# Patient Record
Sex: Female | Born: 1980 | Race: Black or African American | Hispanic: No | Marital: Married | Smoking: Former smoker
Health system: Southern US, Community
[De-identification: ages and names within clinical notes are randomized; demographics above are authoritative.]

## PROBLEM LIST (undated history)

## (undated) DIAGNOSIS — R350 Frequency of micturition: Secondary | ICD-10-CM

## (undated) DIAGNOSIS — Z8669 Personal history of other diseases of the nervous system and sense organs: Secondary | ICD-10-CM

## (undated) DIAGNOSIS — R3915 Urgency of urination: Secondary | ICD-10-CM

## (undated) DIAGNOSIS — J45909 Unspecified asthma, uncomplicated: Secondary | ICD-10-CM

## (undated) DIAGNOSIS — R319 Hematuria, unspecified: Secondary | ICD-10-CM

## (undated) DIAGNOSIS — N201 Calculus of ureter: Secondary | ICD-10-CM

## (undated) DIAGNOSIS — N200 Calculus of kidney: Secondary | ICD-10-CM

## (undated) DIAGNOSIS — Z87442 Personal history of urinary calculi: Secondary | ICD-10-CM

## (undated) HISTORY — PX: TUBAL LIGATION: SHX77

## (undated) HISTORY — PX: TONSILLECTOMY: SUR1361

---
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
## 1999-05-22 ENCOUNTER — Emergency Department (HOSPITAL_COMMUNITY): Admission: EM | Admit: 1999-05-22 | Discharge: 1999-05-22 | Payer: Self-pay | Admitting: Emergency Medicine

## 1999-10-26 ENCOUNTER — Inpatient Hospital Stay (HOSPITAL_COMMUNITY): Admission: AD | Admit: 1999-10-26 | Discharge: 1999-10-26 | Payer: Self-pay | Admitting: *Deleted

## 1999-10-29 ENCOUNTER — Inpatient Hospital Stay (HOSPITAL_COMMUNITY): Admission: AD | Admit: 1999-10-29 | Discharge: 1999-10-29 | Payer: Self-pay | Admitting: Obstetrics & Gynecology

## 1999-11-02 ENCOUNTER — Inpatient Hospital Stay (HOSPITAL_COMMUNITY): Admission: AD | Admit: 1999-11-02 | Discharge: 1999-11-02 | Payer: Self-pay | Admitting: *Deleted

## 1999-11-06 ENCOUNTER — Inpatient Hospital Stay (HOSPITAL_COMMUNITY): Admission: AD | Admit: 1999-11-06 | Discharge: 1999-11-06 | Payer: Self-pay | Admitting: *Deleted

## 1999-11-12 ENCOUNTER — Other Ambulatory Visit: Admission: RE | Admit: 1999-11-12 | Discharge: 1999-11-12 | Payer: Self-pay | Admitting: Obstetrics and Gynecology

## 1999-12-11 ENCOUNTER — Observation Stay (HOSPITAL_COMMUNITY): Admission: AD | Admit: 1999-12-11 | Discharge: 1999-12-11 | Payer: Self-pay | Admitting: Obstetrics & Gynecology

## 1999-12-11 ENCOUNTER — Encounter (INDEPENDENT_AMBULATORY_CARE_PROVIDER_SITE_OTHER): Payer: Self-pay

## 2000-12-22 ENCOUNTER — Emergency Department (HOSPITAL_COMMUNITY): Admission: EM | Admit: 2000-12-22 | Discharge: 2000-12-22 | Payer: Self-pay | Admitting: Emergency Medicine

## 2001-06-11 ENCOUNTER — Emergency Department (HOSPITAL_COMMUNITY): Admission: EM | Admit: 2001-06-11 | Discharge: 2001-06-11 | Payer: Self-pay | Admitting: Emergency Medicine

## 2001-06-11 ENCOUNTER — Encounter: Payer: Self-pay | Admitting: Emergency Medicine

## 2001-06-15 ENCOUNTER — Emergency Department (HOSPITAL_COMMUNITY): Admission: EM | Admit: 2001-06-15 | Discharge: 2001-06-15 | Payer: Self-pay | Admitting: Emergency Medicine

## 2001-06-15 ENCOUNTER — Encounter: Payer: Self-pay | Admitting: Emergency Medicine

## 2002-04-19 ENCOUNTER — Encounter: Payer: Self-pay | Admitting: Emergency Medicine

## 2002-04-19 ENCOUNTER — Emergency Department (HOSPITAL_COMMUNITY): Admission: EM | Admit: 2002-04-19 | Discharge: 2002-04-19 | Payer: Self-pay | Admitting: Emergency Medicine

## 2002-05-07 ENCOUNTER — Encounter: Admission: RE | Admit: 2002-05-07 | Discharge: 2002-05-07 | Payer: Self-pay | Admitting: *Deleted

## 2003-10-19 ENCOUNTER — Inpatient Hospital Stay (HOSPITAL_COMMUNITY): Admission: AD | Admit: 2003-10-19 | Discharge: 2003-10-20 | Payer: Self-pay | Admitting: Obstetrics and Gynecology

## 2004-04-21 ENCOUNTER — Inpatient Hospital Stay (HOSPITAL_COMMUNITY): Admission: AD | Admit: 2004-04-21 | Discharge: 2004-04-22 | Payer: Self-pay | Admitting: Obstetrics and Gynecology

## 2004-05-26 ENCOUNTER — Inpatient Hospital Stay (HOSPITAL_COMMUNITY): Admission: AD | Admit: 2004-05-26 | Discharge: 2004-05-29 | Payer: Self-pay | Admitting: Obstetrics

## 2004-06-05 ENCOUNTER — Inpatient Hospital Stay (HOSPITAL_COMMUNITY): Admission: AD | Admit: 2004-06-05 | Discharge: 2004-06-06 | Payer: Self-pay | Admitting: Obstetrics & Gynecology

## 2004-06-11 ENCOUNTER — Inpatient Hospital Stay (HOSPITAL_COMMUNITY): Admission: AD | Admit: 2004-06-11 | Discharge: 2004-06-14 | Payer: Self-pay | Admitting: Obstetrics & Gynecology

## 2004-06-18 ENCOUNTER — Encounter: Admission: RE | Admit: 2004-06-18 | Discharge: 2004-07-18 | Payer: Self-pay | Admitting: Obstetrics

## 2004-06-27 ENCOUNTER — Inpatient Hospital Stay (HOSPITAL_COMMUNITY): Admission: RE | Admit: 2004-06-27 | Discharge: 2004-06-29 | Payer: Self-pay | Admitting: Obstetrics & Gynecology

## 2004-07-19 ENCOUNTER — Encounter: Admission: RE | Admit: 2004-07-19 | Discharge: 2004-08-18 | Payer: Self-pay | Admitting: Obstetrics & Gynecology

## 2007-01-21 ENCOUNTER — Emergency Department (HOSPITAL_COMMUNITY): Admission: EM | Admit: 2007-01-21 | Discharge: 2007-01-21 | Payer: Self-pay | Admitting: Family Medicine

## 2009-07-29 ENCOUNTER — Inpatient Hospital Stay (HOSPITAL_COMMUNITY): Admission: AD | Admit: 2009-07-29 | Discharge: 2009-07-29 | Payer: Self-pay | Admitting: Obstetrics

## 2009-08-27 ENCOUNTER — Ambulatory Visit (HOSPITAL_COMMUNITY): Admission: RE | Admit: 2009-08-27 | Discharge: 2009-08-27 | Payer: Self-pay | Admitting: Obstetrics & Gynecology

## 2009-08-27 IMAGING — US US OB COMP LESS 14 WK
1 series · 14 of 16 positions shown · non-contrast
Comparison: none

OBSTETRICAL ULTRASOUND:
 This ultrasound was performed in The [HOSPITAL], and the [facility] report will be stored to [REDACTED] PACS.  This report is also available in [HOSPITAL]?s accessANYware.

[Series 1: us ob comp less 14 wk · 14 of 16 slices shown]
[im 1/16]
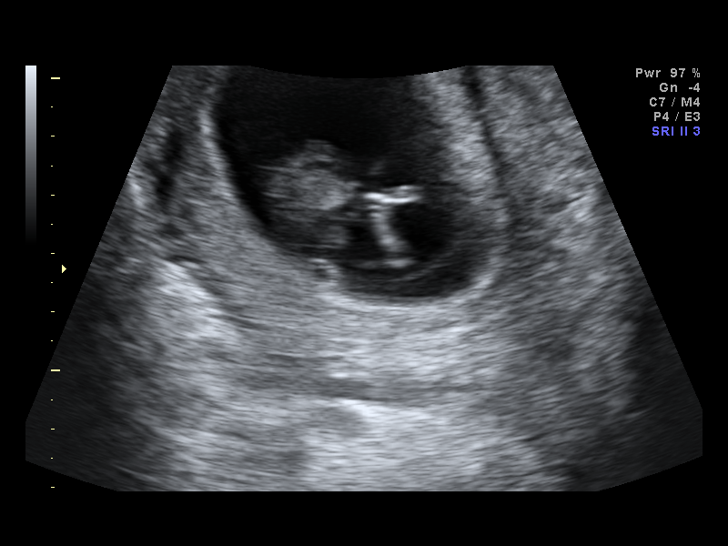
[im 2/16]
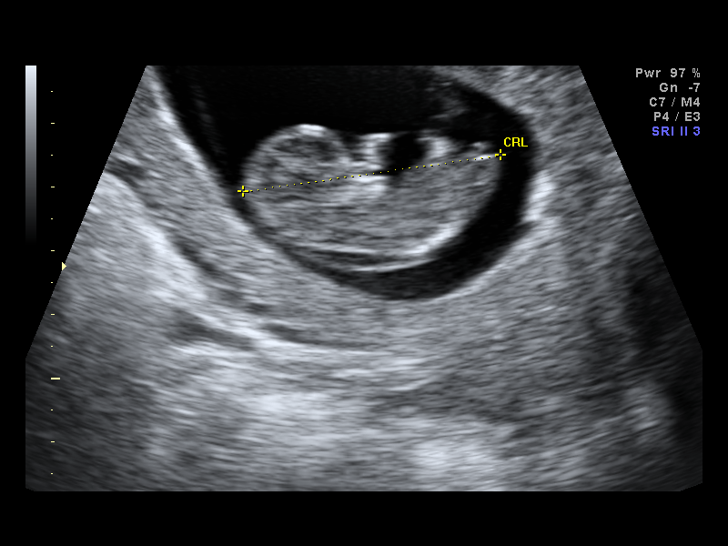
[im 3/16]
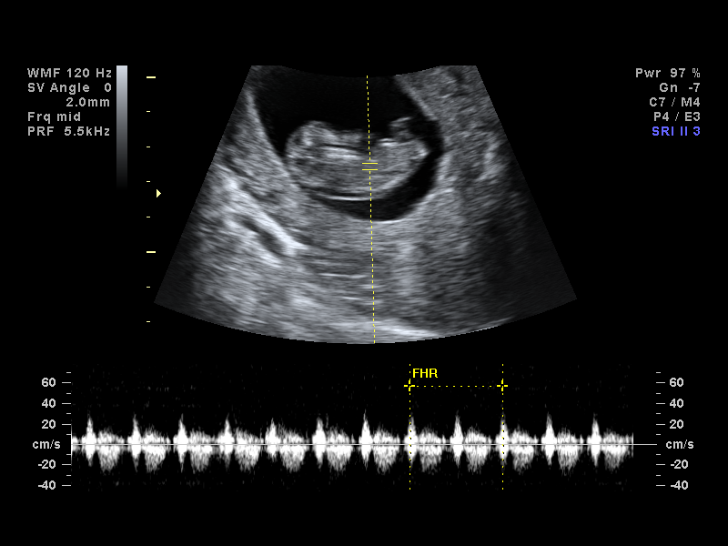
[im 5/16]
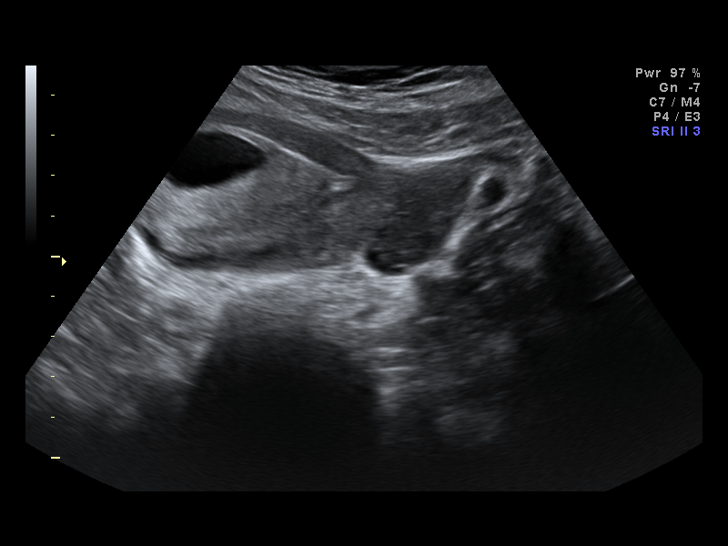
[im 6/16]
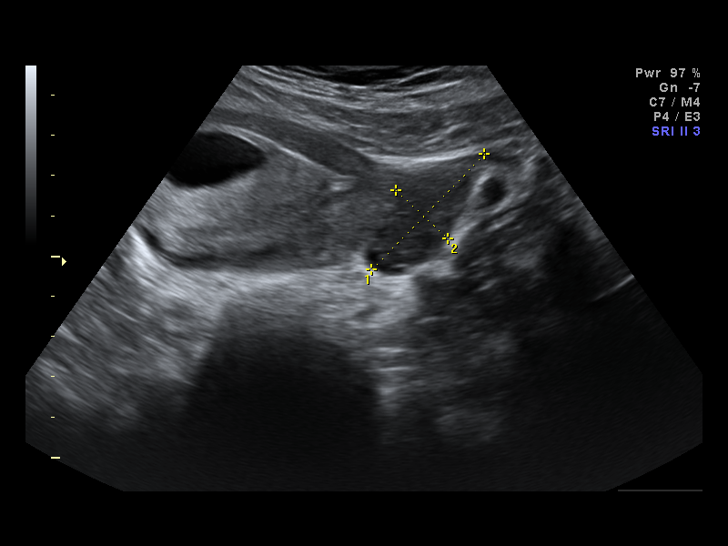
[im 7/16]
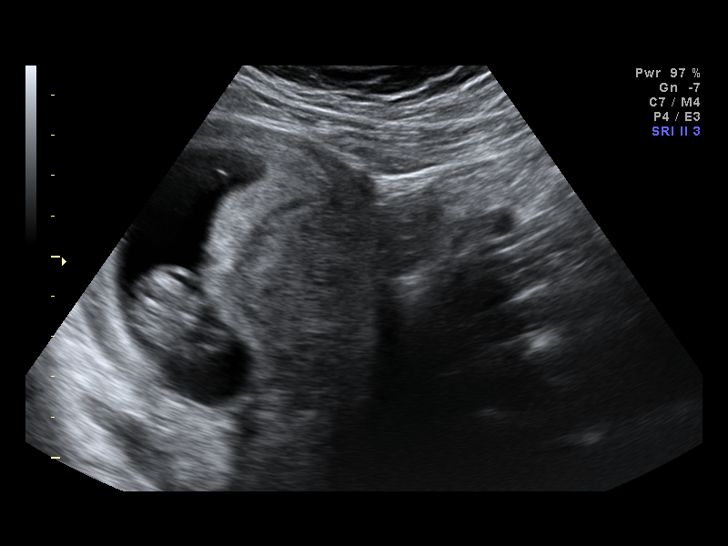
[im 8/16]
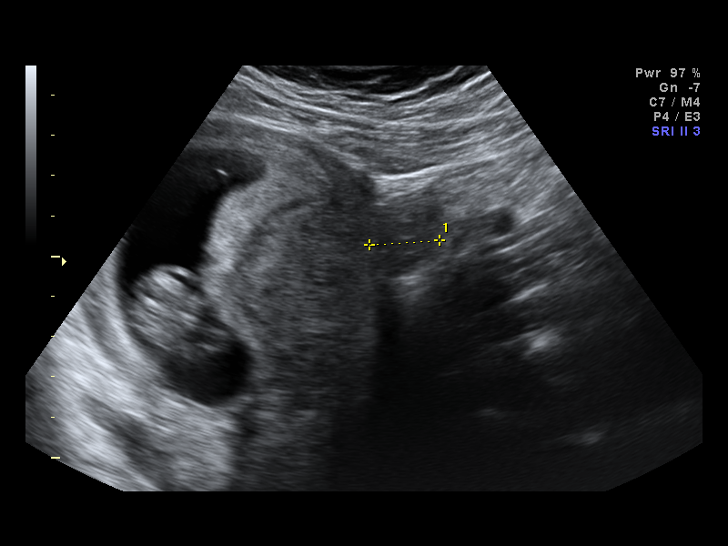
[im 9/16]
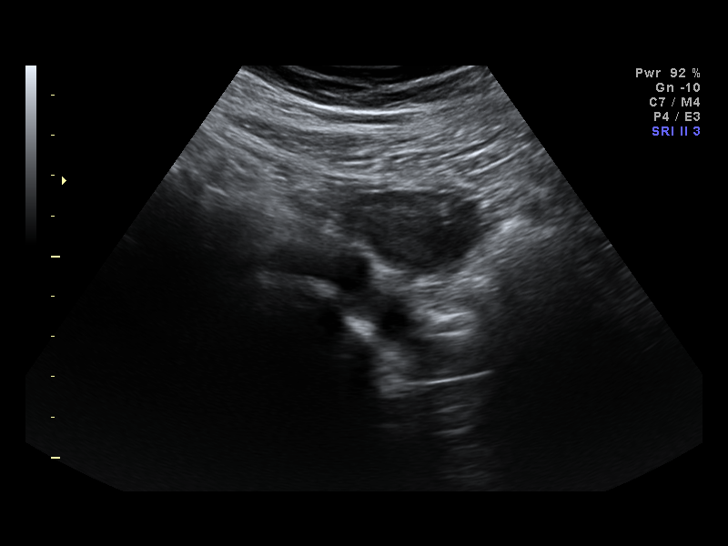
[im 10/16]
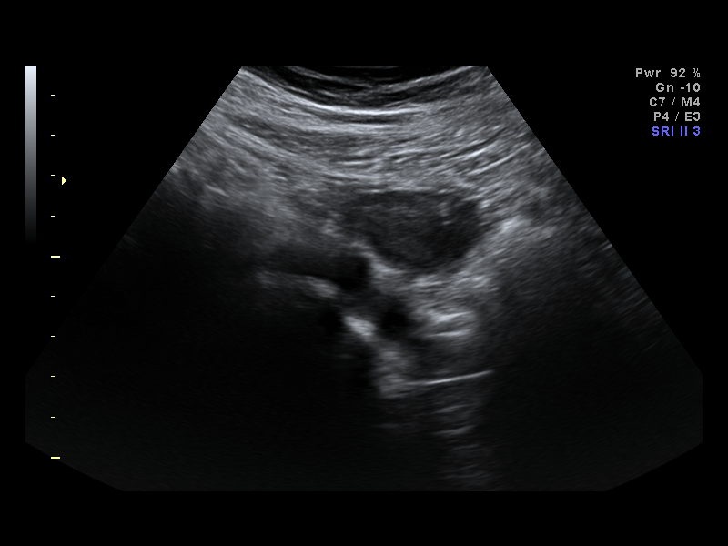
[im 11/16]
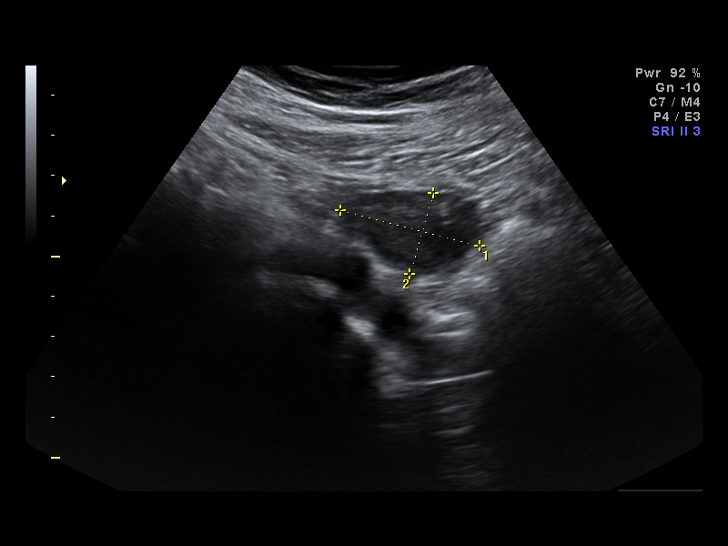
[im 13/16]
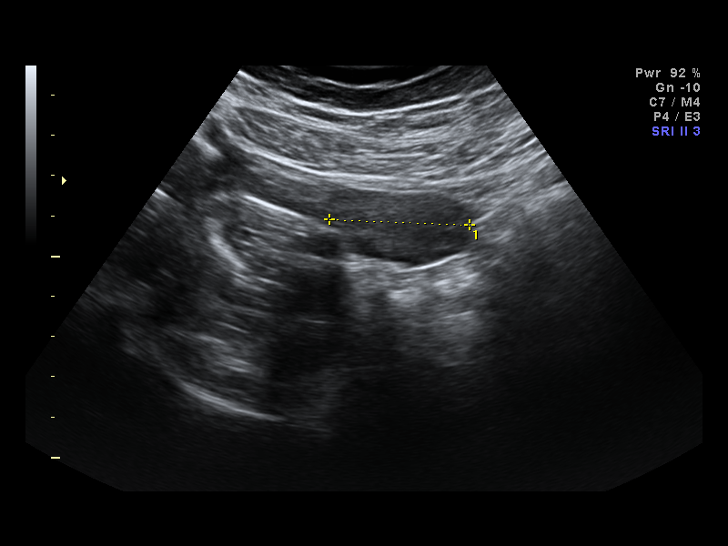
[im 14/16]
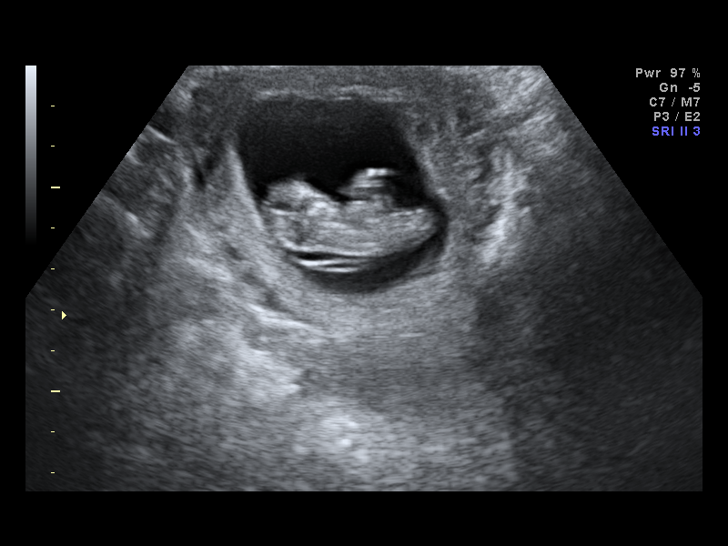
[im 15/16]
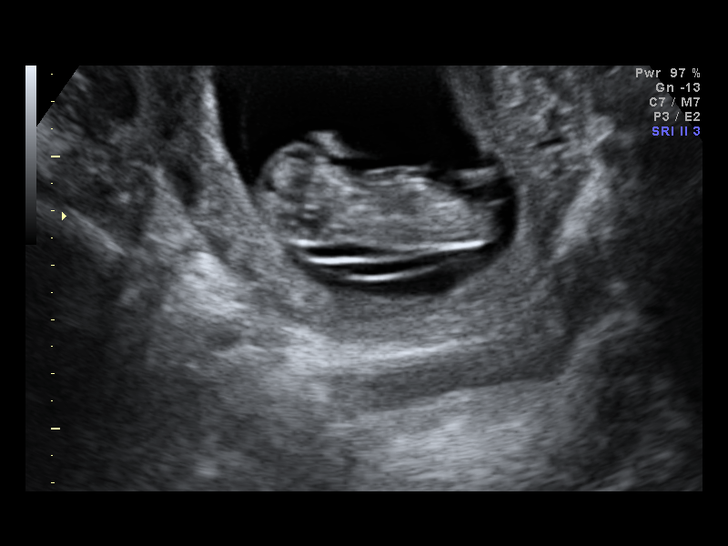
[im 16/16]
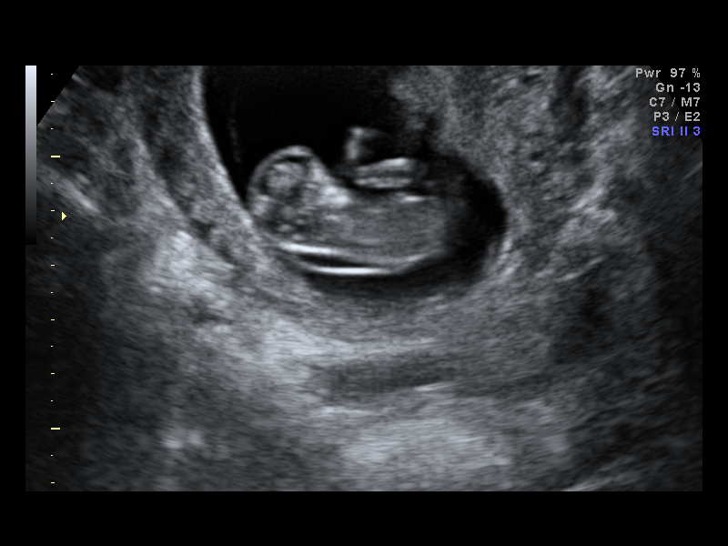

[14 of 16 positions shown; findings below may reference images not displayed]

IMPRESSION: [facility] has also been faxed to the ordering physician.

## 2009-09-04 ENCOUNTER — Ambulatory Visit (HOSPITAL_COMMUNITY): Admission: RE | Admit: 2009-09-04 | Discharge: 2009-09-04 | Payer: Self-pay | Admitting: Obstetrics & Gynecology

## 2009-09-08 ENCOUNTER — Ambulatory Visit (HOSPITAL_COMMUNITY): Admission: RE | Admit: 2009-09-08 | Discharge: 2009-09-08 | Payer: Self-pay | Admitting: Obstetrics & Gynecology

## 2009-10-09 ENCOUNTER — Ambulatory Visit (HOSPITAL_COMMUNITY): Admission: RE | Admit: 2009-10-09 | Discharge: 2009-10-09 | Payer: Self-pay | Admitting: Obstetrics & Gynecology

## 2009-10-20 ENCOUNTER — Ambulatory Visit (HOSPITAL_COMMUNITY): Admission: RE | Admit: 2009-10-20 | Discharge: 2009-10-20 | Payer: Self-pay | Admitting: Obstetrics & Gynecology

## 2009-10-23 ENCOUNTER — Ambulatory Visit (HOSPITAL_COMMUNITY): Admission: RE | Admit: 2009-10-23 | Discharge: 2009-10-23 | Payer: Self-pay | Admitting: Obstetrics & Gynecology

## 2009-10-29 ENCOUNTER — Ambulatory Visit (HOSPITAL_COMMUNITY): Admission: RE | Admit: 2009-10-29 | Discharge: 2009-10-29 | Payer: Self-pay | Admitting: Obstetrics & Gynecology

## 2009-10-29 HISTORY — PX: CERVICAL CERCLAGE: SHX1329

## 2009-11-05 ENCOUNTER — Ambulatory Visit (HOSPITAL_COMMUNITY): Admission: RE | Admit: 2009-11-05 | Discharge: 2009-11-05 | Payer: Self-pay | Admitting: Obstetrics & Gynecology

## 2009-11-19 ENCOUNTER — Ambulatory Visit (HOSPITAL_COMMUNITY): Admission: RE | Admit: 2009-11-19 | Discharge: 2009-11-19 | Payer: Self-pay | Admitting: Obstetrics & Gynecology

## 2009-11-30 ENCOUNTER — Ambulatory Visit: Payer: Self-pay | Admitting: Obstetrics and Gynecology

## 2009-11-30 ENCOUNTER — Inpatient Hospital Stay (HOSPITAL_COMMUNITY): Admission: AD | Admit: 2009-11-30 | Discharge: 2009-11-30 | Payer: Self-pay | Admitting: Obstetrics & Gynecology

## 2009-12-03 ENCOUNTER — Ambulatory Visit (HOSPITAL_COMMUNITY): Admission: RE | Admit: 2009-12-03 | Discharge: 2009-12-03 | Payer: Self-pay | Admitting: Obstetrics & Gynecology

## 2009-12-09 ENCOUNTER — Ambulatory Visit (HOSPITAL_COMMUNITY): Admission: RE | Admit: 2009-12-09 | Discharge: 2009-12-09 | Payer: Self-pay | Admitting: Obstetrics & Gynecology

## 2010-01-02 ENCOUNTER — Inpatient Hospital Stay (HOSPITAL_COMMUNITY): Admission: AD | Admit: 2010-01-02 | Discharge: 2010-01-03 | Payer: Self-pay | Admitting: Obstetrics & Gynecology

## 2010-01-03 ENCOUNTER — Inpatient Hospital Stay (HOSPITAL_COMMUNITY): Admission: AD | Admit: 2010-01-03 | Discharge: 2010-01-06 | Payer: Self-pay | Admitting: Obstetrics & Gynecology

## 2010-01-18 ENCOUNTER — Ambulatory Visit (HOSPITAL_COMMUNITY): Admission: RE | Admit: 2010-01-18 | Discharge: 2010-01-18 | Payer: Self-pay | Admitting: Obstetrics & Gynecology

## 2010-02-23 ENCOUNTER — Ambulatory Visit (HOSPITAL_COMMUNITY): Admission: RE | Admit: 2010-02-23 | Discharge: 2010-02-23 | Payer: Self-pay | Admitting: Obstetrics & Gynecology

## 2010-03-07 ENCOUNTER — Inpatient Hospital Stay (HOSPITAL_COMMUNITY): Admission: AD | Admit: 2010-03-07 | Discharge: 2010-03-07 | Payer: Self-pay | Admitting: Obstetrics & Gynecology

## 2010-03-15 ENCOUNTER — Inpatient Hospital Stay (HOSPITAL_COMMUNITY): Admission: AD | Admit: 2010-03-15 | Discharge: 2010-03-19 | Payer: Self-pay | Admitting: Obstetrics

## 2010-03-17 ENCOUNTER — Encounter: Payer: Self-pay | Admitting: Obstetrics

## 2010-03-19 ENCOUNTER — Encounter: Admission: RE | Admit: 2010-03-19 | Discharge: 2010-04-18 | Payer: Self-pay | Admitting: Obstetrics & Gynecology

## 2010-03-22 ENCOUNTER — Inpatient Hospital Stay (HOSPITAL_COMMUNITY)
Admission: AD | Admit: 2010-03-22 | Discharge: 2010-03-24 | Payer: Self-pay | Source: Home / Self Care | Admitting: Obstetrics

## 2010-04-19 ENCOUNTER — Encounter: Admission: RE | Admit: 2010-04-19 | Discharge: 2010-05-19 | Payer: Self-pay | Admitting: Obstetrics & Gynecology

## 2010-05-14 ENCOUNTER — Ambulatory Visit (HOSPITAL_COMMUNITY): Admission: RE | Admit: 2010-05-14 | Discharge: 2010-05-14 | Payer: Self-pay | Admitting: Obstetrics & Gynecology

## 2010-05-14 HISTORY — PX: LAPAROSCOPIC TUBAL LIGATION: SUR803

## 2010-05-20 ENCOUNTER — Encounter: Admission: RE | Admit: 2010-05-20 | Discharge: 2010-06-19 | Payer: Self-pay | Admitting: Obstetrics & Gynecology

## 2010-06-20 ENCOUNTER — Encounter: Admission: RE | Admit: 2010-06-20 | Discharge: 2010-07-20 | Payer: Self-pay | Admitting: Obstetrics & Gynecology

## 2010-07-21 ENCOUNTER — Encounter: Admission: RE | Admit: 2010-07-21 | Discharge: 2010-07-28 | Payer: Self-pay | Admitting: Obstetrics & Gynecology

## 2010-08-21 ENCOUNTER — Encounter: Admission: RE | Admit: 2010-08-21 | Discharge: 2010-09-20 | Payer: Self-pay | Admitting: Obstetrics & Gynecology

## 2010-09-21 ENCOUNTER — Encounter
Admission: RE | Admit: 2010-09-21 | Discharge: 2010-10-21 | Payer: Self-pay | Source: Home / Self Care | Attending: Obstetrics & Gynecology | Admitting: Obstetrics & Gynecology

## 2010-10-22 ENCOUNTER — Encounter
Admission: RE | Admit: 2010-10-22 | Discharge: 2010-11-21 | Payer: Self-pay | Source: Home / Self Care | Attending: Obstetrics & Gynecology | Admitting: Obstetrics & Gynecology

## 2010-11-22 ENCOUNTER — Encounter
Admission: RE | Admit: 2010-11-22 | Discharge: 2010-12-07 | Payer: Self-pay | Source: Home / Self Care | Attending: Obstetrics & Gynecology | Admitting: Obstetrics & Gynecology

## 2010-11-28 ENCOUNTER — Encounter: Payer: Self-pay | Admitting: Obstetrics & Gynecology

## 2010-11-28 ENCOUNTER — Encounter: Payer: Self-pay | Admitting: Obstetrics

## 2010-12-23 ENCOUNTER — Encounter (HOSPITAL_COMMUNITY)
Admission: RE | Admit: 2010-12-23 | Discharge: 2010-12-23 | Disposition: A | Discharge status: Home / Self Care | Payer: Self-pay | Source: Ambulatory Visit | Attending: Obstetrics & Gynecology | Admitting: Obstetrics & Gynecology

## 2010-12-23 DIAGNOSIS — O923 Agalactia: Secondary | ICD-10-CM | POA: Insufficient documentation

## 2011-01-23 ENCOUNTER — Encounter (HOSPITAL_COMMUNITY)
Admission: RE | Admit: 2011-01-23 | Discharge: 2011-01-23 | Disposition: A | Discharge status: Home / Self Care | Payer: Self-pay | Source: Ambulatory Visit | Attending: Obstetrics & Gynecology | Admitting: Obstetrics & Gynecology

## 2011-01-23 DIAGNOSIS — O923 Agalactia: Secondary | ICD-10-CM | POA: Insufficient documentation

## 2011-01-23 LAB — CBC
Hemoglobin: 12.8 g/dL (ref 12.0–15.0)
MCHC: 33.2 g/dL (ref 30.0–36.0)
RBC: 4.39 MIL/uL (ref 3.87–5.11)
WBC: 6.9 10*3/uL (ref 4.0–10.5)

## 2011-01-23 LAB — PREGNANCY, URINE: Preg Test, Ur: NEGATIVE

## 2011-01-24 LAB — LACTATE DEHYDROGENASE: LDH: 237 U/L (ref 94–250)

## 2011-01-24 LAB — CBC
HCT: 33 % — ABNORMAL LOW (ref 36.0–46.0)
HCT: 34.1 % — ABNORMAL LOW (ref 36.0–46.0)
Hemoglobin: 10.5 g/dL — ABNORMAL LOW (ref 12.0–15.0)
MCHC: 32.9 g/dL (ref 30.0–36.0)
MCHC: 33.3 g/dL (ref 30.0–36.0)
MCV: 88 fL (ref 78.0–100.0)
Platelets: 256 10*3/uL (ref 150–400)
Platelets: 274 10*3/uL (ref 150–400)
RBC: 3.57 MIL/uL — ABNORMAL LOW (ref 3.87–5.11)
RBC: 3.79 MIL/uL — ABNORMAL LOW (ref 3.87–5.11)
RDW: 17.3 % — ABNORMAL HIGH (ref 11.5–15.5)
WBC: 7.8 10*3/uL (ref 4.0–10.5)
WBC: 8.4 10*3/uL (ref 4.0–10.5)

## 2011-01-24 LAB — COMPREHENSIVE METABOLIC PANEL
ALT: 51 U/L — ABNORMAL HIGH (ref 0–35)
AST: 66 U/L — ABNORMAL HIGH (ref 0–37)
AST: 71 U/L — ABNORMAL HIGH (ref 0–37)
Albumin: 2.7 g/dL — ABNORMAL LOW (ref 3.5–5.2)
Alkaline Phosphatase: 85 U/L (ref 39–117)
BUN: <1 mg/dL — ABNORMAL LOW (ref 6–23)
CO2: 26 mEq/L (ref 19–32)
Calcium: 7.8 mg/dL — ABNORMAL LOW (ref 8.4–10.5)
Chloride: 104 mEq/L (ref 96–112)
Chloride: 105 mEq/L (ref 96–112)
Creatinine, Ser: 0.5 mg/dL (ref 0.4–1.2)
GFR calc Af Amer: >60 mL/min (ref >60)
GFR calc Af Amer: >60 mL/min (ref >60)
GFR calc non Af Amer: >60 mL/min (ref >60)
Glucose, Bld: 91 mg/dL (ref 70–99)
Sodium: 138 mEq/L (ref 135–145)
Total Bilirubin: 0.4 mg/dL (ref 0.3–1.2)
Total Bilirubin: 0.5 mg/dL (ref 0.3–1.2)
Total Protein: 6.7 g/dL (ref 6.0–8.3)

## 2011-01-24 LAB — COMPREHENSIVE METABOLIC PANEL WITH GFR
ALT: 45 U/L — ABNORMAL HIGH (ref 0–35)
AST: 46 U/L — ABNORMAL HIGH (ref 0–37)
Albumin: 2.7 g/dL — ABNORMAL LOW (ref 3.5–5.2)
Alkaline Phosphatase: 89 U/L (ref 39–117)
BUN: 1 mg/dL — ABNORMAL LOW (ref 6–23)
CO2: 26 meq/L (ref 19–32)
Calcium: 7.2 mg/dL — ABNORMAL LOW (ref 8.4–10.5)
Chloride: 104 meq/L (ref 96–112)
Creatinine, Ser: 0.58 mg/dL (ref 0.4–1.2)
GFR calc non Af Amer: >60 mL/min
Glucose, Bld: 139 mg/dL — ABNORMAL HIGH (ref 70–99)
Potassium: 3 meq/L — ABNORMAL LOW (ref 3.5–5.1)
Sodium: 136 meq/L (ref 135–145)
Total Bilirubin: 0.4 mg/dL (ref 0.3–1.2)
Total Protein: 6.4 g/dL (ref 6.0–8.3)

## 2011-01-24 LAB — URINALYSIS, ROUTINE W REFLEX MICROSCOPIC
Bilirubin Urine: NEGATIVE
Glucose, UA: NEGATIVE mg/dL
Protein, ur: NEGATIVE mg/dL
Specific Gravity, Urine: <1.005 — ABNORMAL LOW (ref 1.005–1.030)
Urobilinogen, UA: 0.2 mg/dL (ref 0.0–1.0)

## 2011-01-24 LAB — URIC ACID
Uric Acid, Serum: 5 mg/dL (ref 2.4–7.0)
Uric Acid, Serum: 5.2 mg/dL (ref 2.4–7.0)

## 2011-01-24 LAB — URINE MICROSCOPIC-ADD ON

## 2011-01-24 LAB — WET PREP, GENITAL
Clue Cells Wet Prep HPF POC: NONE SEEN
Trich, Wet Prep: NONE SEEN

## 2011-01-25 LAB — CBC
HCT: 34.9 % — ABNORMAL LOW (ref 36.0–46.0)
Hemoglobin: 11.8 g/dL — ABNORMAL LOW (ref 12.0–15.0)
MCHC: 33.7 g/dL (ref 30.0–36.0)
MCHC: 33.8 g/dL (ref 30.0–36.0)
MCV: 87.8 fL (ref 78.0–100.0)
Platelets: 222 10*3/uL (ref 150–400)
RDW: 16.1 % — ABNORMAL HIGH (ref 11.5–15.5)
RDW: 16.1 % — ABNORMAL HIGH (ref 11.5–15.5)

## 2011-01-28 LAB — CBC
HCT: 36.3 % (ref 36.0–46.0)
MCHC: 33.1 g/dL (ref 30.0–36.0)
MCV: 90.6 fL (ref 78.0–100.0)
Platelets: 227 10*3/uL (ref 150–400)
RDW: 15.7 % — ABNORMAL HIGH (ref 11.5–15.5)

## 2011-01-28 LAB — DIFFERENTIAL
Basophils Absolute: 0.1 10*3/uL (ref 0.0–0.1)
Eosinophils Relative: 1 % (ref 0–5)
Lymphs Abs: 2.4 10*3/uL (ref 0.7–4.0)
Monocytes Absolute: 1.1 10*3/uL — ABNORMAL HIGH (ref 0.1–1.0)

## 2011-01-28 LAB — BETA-2-GLYCOPROTEIN I ABS, IGG/M/A: Beta-2-Glycoprotein I IgM: <3 U/mL (ref <=15)

## 2011-01-28 LAB — CARDIOLIPIN ANTIBODIES, IGG, IGM, IGA: Anticardiolipin IgA: <3 APL U/mL — ABNORMAL LOW (ref <10)

## 2011-01-28 LAB — LUPUS ANTICOAGULANT PANEL

## 2011-01-28 LAB — WET PREP, GENITAL: Yeast Wet Prep HPF POC: NONE SEEN

## 2011-02-07 LAB — CBC
HCT: 40.2 % (ref 36.0–46.0)
MCHC: 33.1 g/dL (ref 30.0–36.0)
MCV: 89.2 fL (ref 78.0–100.0)
Platelets: 265 10*3/uL (ref 150–400)
RDW: 16 % — ABNORMAL HIGH (ref 11.5–15.5)

## 2011-02-11 LAB — WET PREP, GENITAL
Clue Cells Wet Prep HPF POC: NONE SEEN
Trich, Wet Prep: NONE SEEN

## 2011-02-11 LAB — CBC
Hemoglobin: 12.7 g/dL (ref 12.0–15.0)
MCHC: 32.8 g/dL (ref 30.0–36.0)
RBC: 4.26 MIL/uL (ref 3.87–5.11)
RDW: 14.6 % (ref 11.5–15.5)

## 2011-02-11 LAB — URINALYSIS, ROUTINE W REFLEX MICROSCOPIC
Bilirubin Urine: NEGATIVE
Ketones, ur: NEGATIVE mg/dL
Leukocytes, UA: NEGATIVE
Nitrite: NEGATIVE
Protein, ur: NEGATIVE mg/dL

## 2011-02-23 ENCOUNTER — Encounter (HOSPITAL_COMMUNITY)
Admission: RE | Admit: 2011-02-23 | Discharge: 2011-02-23 | Disposition: A | Discharge status: Home / Self Care | Payer: Self-pay | Source: Ambulatory Visit | Attending: Obstetrics & Gynecology | Admitting: Obstetrics & Gynecology

## 2011-02-23 DIAGNOSIS — O923 Agalactia: Secondary | ICD-10-CM | POA: Insufficient documentation

## 2011-03-25 NOTE — Discharge Summary (Signed)
NAME:  Moccia, ARELIA VOLPE                       ACCOUNT NO.:  [id number]   MEDICAL RECORD NO.:  [id number]                   PATIENT TYPE:  INP   LOCATION:  9306                                 FACILITY:  [facility]   PHYSICIAN:  Roseanna Rainbow, M.D.         DATE OF BIRTH:  28-Jul-1981   DATE OF ADMISSION:  06/05/2004  DATE OF DISCHARGE:  06/06/2004                                 DISCHARGE SUMMARY   CHIEF COMPLAINT:  The patient is a 30 year old, gravida 2, para 1, status  post NSVD on May 27, 2004 complaining of fever and headache.   HISTORY OF PRESENT ILLNESS:  As above.   ALLERGIES:  PENICILLIN.   PHYSICAL EXAMINATION:  GENERAL:  Well-developed female in no distress.  HEENT:  Normocephalic, atraumatic.  NECK:  No nuchal rigidity.  LUNGS:  Clear to auscultation bilaterally.  HEART:  Regular rate and rhythm without murmurs, rubs or gallops.  BREASTS:  Nonengorged.  ABDOMEN:  Slight suprapubic tenderness.  PELVIC:  Deferred.   ASSESSMENT:  Rule out endometritis.   PLAN:  Admission, parenteral antibiotics.   HOSPITAL COURSE:  The patient was admitted and started on intravenous  gentamycin and clindamycin.  She defervesced within the 24 hour period.  She  was discharged to home on hospital day #2.   DISCHARGE DIAGNOSES:  Endometritis.   CONDITION:  Stable.   DIET:  Regular.   ACTIVITY:  No restrictions.   MEDICATIONS:  Resume home medications.  Cleocin 300 mg q.6 h.   DISPOSITION:  The patient was to followup in the office on August 4.                                               Roseanna Rainbow, M.D.   Judee Clara  D:  07/08/2004  T:  07/09/2004  Job:  [id number]

## 2011-03-25 NOTE — H&P (Signed)
[facility]  Patient:    Carol Miller                     MRN: [id number] Adm. Date:  [id number] Attending:  Cleatrice Burke Dictator:   Vance Gather Duplantis, C.N.M.                         History and Physical  HISTORY OF PRESENT ILLNESS:   Carol Miller is a 30 year old single black female, gravida 1, para 0, at 17-6/7 weeks by LMP, who presents by EMS complaining of sudden onset of lower abdominal pain and spotting.  She reports intermittent pain that appears to be occurring approximately every three to five minutes.  She reports some vaginal bleeding that has occurred with the onset of pain but no actual gushes of fluid at this point.  She also reports some diarrhea, nausea and vomiting.  Her pregnancy has been followed at [facility] since 13 weeks and has been at risk for a history of:  #1 - Chlamydia and gonorrhea in December of 2000, #2 - a history of first trimester drug and alcohol use, #3 - first trimester bleeding, and #4 - penicillin allergy.  She reports stopping all drugs and alcohol since finding out she was pregnant.  OB/GYN HISTORY:               She is a primigravida.  She reports being on Ortho Tri-Cyclen but stopped those when she found out she was pregnant.  She was treated for gonorrhea and Chlamydia prior to her first appointment in our office.  ALLERGIES:                    She is allergic to PENICILLIN.  GENERAL MEDICAL HISTORY:      She reports having had the usual childhood diseases. She reports her only other medical issues are surgery for her adenoids removed s a child and she was observed overnight at one point for possibly a seizure when she was a child.  FAMILY HISTORY:               Significant for paternal grandmother with hypertension, adult-onset diabetes and stroke.  GENETIC HISTORY:              Her genetic history is significant for the patients father who had extra fingers on both hands  and otherwise is negative.  SOCIAL HISTORY:               She is single.  The father of the baby is minimally involved; he is basically just aware of her pregnancy, but not being supportive. She is of the [employer].  She denies any current drug, alcohol or smoking but does report positive to the three early in her pregnancy.  PRENATAL LABORATORY DATA:     Gonorrhea and Chlamydia were positive in December. Basically, prenatal labs are currently unavailable.  PHYSICAL EXAMINATION:  VITAL SIGNS:                  Her vital signs are stable.  She is afebrile.  HEENT:                        Grossly within normal limits.  HEART:                        Regular rhythm  and rate.  CHEST:                        Clear.  BREASTS:                      Soft and nontender.  ABDOMEN:                      Gravid at about 56- to 20-week-size.  Fetal heart  rate is audible with Doppler at 156.  PELVIC:                       Speculum exam reveals copious clear amniotic fluid noted in the vaginal vault with some blood tinge to it.  Cervix appears to be approximately 5 to 6 cm, 100% effaced with what appears to be a vertex applied o the cervix.  EXTREMITIES:                  Within normal limits.  LABORATORY DATA:              Catheterized UA:  Ketones greater than 80 and protein negative.  CBC today:  WBC count is 11.6, hemoglobin is 13 and platelets are 240,000.  Gonorrhea and Chlamydia were collected.  ASSESSMENT:                   Intrauterine pregnancy at 18 weeks with imminent delivery.  PLAN:                         Admit to [facility] and Dr. Cecilio Asper has been notified and will follow patient. DD:  12/11/99 TD:  12/11/99 Job: [id number]

## 2011-03-25 NOTE — Discharge Summary (Signed)
NAME:  Carol Miller, Carol Miller                       ACCOUNT NO.:  [id number]   MEDICAL RECORD NO.:  [id number]                   PATIENT TYPE:  INP   LOCATION:  9320                                 FACILITY:  [facility]   PHYSICIAN:  Roseanna Rainbow, M.D.         DATE OF BIRTH:  04/05/1981   DATE OF ADMISSION:  06/11/2004  DATE OF DISCHARGE:  06/14/2004                                 DISCHARGE SUMMARY   CHIEF COMPLAINT:  The patient is a 30 year old gravida 2 para 1 complaining  of a sore right breast, fever, headaches, nausea, vomiting, and body aches.   HISTORY OF PRESENT ILLNESS:  Please see the above.  The patient had been  admitted from July 29 to June 06, 2004 with similar complaint.   MEDICATIONS:  Clindamycin.   ALLERGIES:  PENICILLIN.   PAST OBSTETRICAL AND GYNECOLOGICAL HISTORY:  She is status post a  spontaneous vaginal delivery 2 weeks ago complicated by pregnancy-induced  hypertension and she has a history of a spontaneous abortion.   PAST SURGICAL HISTORY:  T&A.   PAST MEDICAL HISTORY:  She denies.   SOCIAL HISTORY:  No tobacco, ethanol, or substance abuse.   PHYSICAL EXAMINATION:  VITAL SIGNS:  Temperature 102.9, pulse 133,  respiratory rate 24, blood pressures 110 to 150s over 70s to 100.  GENERAL APPEARANCE:  Nontoxic-appearing.  HEENT:  Normocephalic, atraumatic.  NECK:  No nuchal rigidity.  LUNGS:  Clear to auscultation bilaterally.  HEART:  Tachycardia, regular rhythm.  BREASTS:  Right breast engorged.  No masses, no erythema.  BACK:  No costovertebral angle tenderness.  Negative Kernig's or  Brudzinski's sign.  ABDOMEN:  Nontender.  PELVIC:  On speculum exam there is small heme noted.  The uterus is slightly  enlarged, nontender.  The adnexa are nonpalpable, nontender.  EXTREMITIES:  Nontender.  NEUROLOGIC:  Nonfocal.   LABORATORY DATA:  White count 12,200.  Urinalysis:  Small leukocyte  esterase.   ASSESSMENT:  Likely puerperal mastitis.  Consult  with anesthesiology.   PLAN:  A 23-hour observation, erythromycin, and the patient was counseled to  express her milk and place warm compresses.   Anesthesia was consulted and her symptoms were not felt to be related to any  complications from her epidural catheter during labor.  Her white count was  noted to be 21,600 and on hospital day #1 she noted decreased right breast  pain.  Please note that shortly after the patient was admitted she was  started on intravenous erythromycin.  Milk cultures were no growth.  The  gram stain was remarkable for gram positive cocci.  The right breast was  noted to have increased erythema.  At this point a breast ultrasound was  performed and was consistent with mastitis.  There was no evidence of an  abscess.  She defervesced.  Her white blood cell count on hospital day #2  was 13,300.  She was discharged to home on hospital day #3  and had remained  afebrile for the remainder of her hospital course.   DISCHARGE DIAGNOSIS:  Puerperal mastitis.   CONDITION:  Stable.   DIET:  Regular.   ACTIVITY:  Ad lib.   MEDICATIONS:  1. To resume previous medications.  2. Erythromycin 250 mg p.o. q.i.d.   SPECIAL INSTRUCTIONS:  Warm compresses to the breast.  Frequently express  breast milk.   DISPOSITION:  The patient was to follow up in the office in 1 week.                                               Roseanna Rainbow, M.D.    Judee Clara  D:  06/24/2004  T:  06/24/2004  Job:  [id number]

## 2011-03-26 ENCOUNTER — Encounter (HOSPITAL_COMMUNITY)
Admission: RE | Admit: 2011-03-26 | Discharge: 2011-03-26 | Disposition: A | Discharge status: Home / Self Care | Payer: PRIVATE HEALTH INSURANCE | Source: Ambulatory Visit | Attending: Obstetrics & Gynecology | Admitting: Obstetrics & Gynecology

## 2011-03-26 DIAGNOSIS — O923 Agalactia: Secondary | ICD-10-CM | POA: Insufficient documentation

## 2011-04-10 ENCOUNTER — Inpatient Hospital Stay (HOSPITAL_COMMUNITY)
Admission: AD | Admit: 2011-04-10 | Discharge: 2011-04-11 | Disposition: A | Discharge status: Home / Self Care | Payer: PRIVATE HEALTH INSURANCE | Source: Ambulatory Visit | Attending: Obstetrics & Gynecology | Admitting: Obstetrics & Gynecology

## 2011-04-10 DIAGNOSIS — N949 Unspecified condition associated with female genital organs and menstrual cycle: Secondary | ICD-10-CM

## 2011-04-10 DIAGNOSIS — N390 Urinary tract infection, site not specified: Secondary | ICD-10-CM | POA: Insufficient documentation

## 2011-04-10 LAB — URINALYSIS, ROUTINE W REFLEX MICROSCOPIC
Glucose, UA: NEGATIVE mg/dL
Ketones, ur: 15 mg/dL — AB
Nitrite: POSITIVE — AB
Protein, ur: 100 mg/dL — AB
pH: 6.5 (ref 5.0–8.0)

## 2011-04-10 LAB — URINE MICROSCOPIC-ADD ON

## 2011-04-10 LAB — POCT PREGNANCY, URINE: Preg Test, Ur: NEGATIVE

## 2011-04-13 LAB — URINE CULTURE
Colony Count: >=100000
Culture  Setup Time: 201206041004

## 2011-11-21 ENCOUNTER — Ambulatory Visit
Admission: RE | Admit: 2011-11-21 | Discharge: 2011-11-21 | Disposition: A | Discharge status: Home / Self Care | Payer: 59 | Source: Ambulatory Visit | Attending: Family Medicine | Admitting: Family Medicine

## 2011-11-21 ENCOUNTER — Other Ambulatory Visit: Payer: Self-pay | Admitting: Family Medicine

## 2011-11-29 ENCOUNTER — Encounter (HOSPITAL_COMMUNITY): Payer: Self-pay | Admitting: Pharmacy Technician

## 2011-11-29 ENCOUNTER — Other Ambulatory Visit: Payer: Self-pay | Admitting: Urology

## 2011-11-29 ENCOUNTER — Encounter (HOSPITAL_COMMUNITY): Payer: Self-pay | Admitting: *Deleted

## 2011-11-29 NOTE — Progress Notes (Signed)
Instructed to bring blue [employer], insurance card and picture ID to [facility]  Day of procedure. Laxative as directed by doctor plenty of fluids that day and light dinner. No aspirin, ibuprofen herbal or vitamins as directed by [place] [facility] instructions.

## 2011-11-30 NOTE — H&P (Signed)
History of Present Illness  Nephrolithiasis: She has passed stones spontaneously previously. She underwent a CT scan on 11/21/11 which revealed Bilateral nephrolithiasis as well as a 10 mm right UPJ stone.  24 hour urine: Elevated calcium and oxalate levels.   Probable left renal cysts: Low density areas were noted on her CT scan in 1/13 most consistent with cysts.       31 YO female seen today as a referral from [place], [place] for evaluation of right 10 mm UPJ calculus seen on CTU 11/21/11. Has had 1 month abdominal pain. Has h/o nephrolithasis.   Interval HX: She was seen last week with abdominal pain and nausea and was found to have a stone at the right UPJ with some hydronephrosis noted.   Past Medical History Problems  1. History of  Asthma 493.90 2. History of  Seizure  Surgical History Problems  1. History of  Tubal Ligation V25.2  Current Meds 1. Cipro 500 MG Oral Tablet; Therapy: (Recorded:18Jan2013) to 2. Ibuprofen 600 MG Oral Tablet; Therapy: (Recorded:18Jan2013) to 3. Nucynta 50 MG Oral Tablet; TAKE 1 TABLET Every 6 hours PRN; Therapy: 18Jan2013 to (Last  Rx:18Jan2013) 4. Promethazine HCl 12.5 MG Oral Tablet; TAKE 1 TABLET EVERY 6 HOURS AS NEEDED; Therapy:  18Jan2013 to (Evaluate:23Jan2013)  Requested for: 18Jan2013; Last Rx:18Jan2013 5. Trimethoprim 100 MG Oral Tablet; Take 1 tablet daily; Therapy: 18Jan2013 to  (Evaluate:17Feb2013)  Requested for: 18Jan2013; Last Rx:18Jan2013  Allergies Medication  1. Penicillins  Family History Problems  1. Family history of  Nephrolithiasis 2. Family history of  Type 2 Diabetes Mellitus  Social History Problems  1. Marital History - Currently Married 2. Never A Smoker 3. Occupation: Charity fundraiser Denied  4. History of  Alcohol Use 5. History of  Caffeine Use  Review of Systems Genitourinary, constitutional, skin, eye, otolaryngeal, hematologic/lymphatic, cardiovascular, pulmonary, endocrine, musculoskeletal, gastrointestinal,  neurological and psychiatric system(s) were reviewed and pertinent findings if present are noted.  Genitourinary: urinary frequency, nocturia, hematuria and initiating urination requires straining.  Gastrointestinal: nausea and abdominal pain.  Constitutional: feeling tired (fatigue).  Musculoskeletal: back pain.    Vitals Vital Signs [Data Includes: Last 1 Day]  18Jan2013 11:01AM  BMI Calculated: 35.73 BSA Calculated: 2.12 Height: 5 ft 6.5 in Weight: 225 lb  Blood Pressure: 115 / 80 Temperature: 98.4 F Heart Rate: 84  Physical Exam Constitutional: Well nourished and well developed . No acute distress. The patient appears well hydrated.  ENT:. The ears and nose are normal in appearance.  Neck: The appearance of the neck is normal.  Pulmonary: No respiratory distress.  Cardiovascular: Heart rate and rhythm are normal.  Abdomen: The abdomen is mildly obese. The abdomen is soft and nontender. Mild tenderness in the RLQ is present, but no suprapubic tenderness and no LLQ tenderness. mild right CVA tenderness, but no left CVA tenderness.  Genitourinary:. Deferred.  Results/Data  The following images/tracing/specimen were independently visualized:  CT scan as above as well as her right renal ultrasound.  Will request old records/history: I will attempt to obtain notes from her previous urologist as well as her 24-hour urine results.    Assessment Assessed  1. Proximal Ureteral Stone On The Right 592.1 2. Nephrolithiasis Of Both Kidneys 592.0 3. Probable  Renal Cyst Left 593.2    I have discussed the treatment options with her today. The stone is too small to warrant a percutaneous procedure. It is large enough that it would be best managed with lithotripsy since it is at the  UPJ. We discussed ureteroscopy as an alternative option. I went over lithotripsy with her in detail including its risks, complications and limitations. She understands and has elected to proceed.   Plan Proximal  Ureteral Stone On The Right (592.1)  1. HYDROmorphone HCl 4 MG Oral Tablet; TAKE 1 TABLET EVERY 4 TO 6 HOURS AS NEEDED  FOR PAIN; Therapy: 22Jan2013 to (Complete:29Jan2013); Last Rx:22Jan2013 2. HYDROmorphone HCl 4 MG Oral Tablet; TAKE 1 TABLET EVERY 4 TO 6 HOURS AS NEEDED  FOR PAIN; Therapy: 22Jan2013 to (Complete:29Jan2013); Last Rx:22Jan2013 3. Follow-up Schedule Surgery Office  Follow-up  Requested for: 22Jan2013 Proximal Ureteral Stone On The Right (592.1),   4. Get Outside Records Office  Follow-up  Requested for: 22Jan2013    1. Obtain notes and labs from her previous urologist. 2. She will be scheduled for lithotripsy of her right UPJ stone.

## 2011-12-01 ENCOUNTER — Encounter (HOSPITAL_COMMUNITY)
Admission: RE | Disposition: A | Discharge status: Home Health | Payer: Self-pay | Source: Ambulatory Visit | Attending: Urology

## 2011-12-01 ENCOUNTER — Encounter (HOSPITAL_COMMUNITY): Payer: Self-pay | Admitting: *Deleted

## 2011-12-01 ENCOUNTER — Ambulatory Visit (HOSPITAL_COMMUNITY): Payer: 59

## 2011-12-01 ENCOUNTER — Ambulatory Visit (HOSPITAL_COMMUNITY)
Admission: RE | Admit: 2011-12-01 | Discharge: 2011-12-01 | Disposition: A | Discharge status: Home Health | Payer: 59 | Source: Ambulatory Visit | Attending: Urology | Admitting: Urology

## 2011-12-01 DIAGNOSIS — N201 Calculus of ureter: Secondary | ICD-10-CM

## 2011-12-01 DIAGNOSIS — J45909 Unspecified asthma, uncomplicated: Secondary | ICD-10-CM | POA: Insufficient documentation

## 2011-12-01 DIAGNOSIS — N200 Calculus of kidney: Secondary | ICD-10-CM | POA: Insufficient documentation

## 2011-12-01 DIAGNOSIS — E669 Obesity, unspecified: Secondary | ICD-10-CM | POA: Insufficient documentation

## 2011-12-01 DIAGNOSIS — N1330 Unspecified hydronephrosis: Secondary | ICD-10-CM | POA: Insufficient documentation

## 2011-12-01 HISTORY — PX: EXTRACORPOREAL SHOCK WAVE LITHOTRIPSY: SHX1557

## 2011-12-01 SURGERY — LITHOTRIPSY, ESWL
Anesthesia: LOCAL | Laterality: Right

## 2011-12-01 MED ORDER — DIAZEPAM 5 MG PO TABS
10.0000 mg | ORAL_TABLET | ORAL | Status: AC
  Administered 2011-12-01: 10 mg via ORAL

## 2011-12-01 MED ORDER — DIPHENHYDRAMINE HCL 25 MG PO CAPS
25.0000 mg | ORAL_CAPSULE | ORAL | Status: AC
  Administered 2011-12-01: 25 mg via ORAL

## 2011-12-01 MED ORDER — DIAZEPAM 5 MG PO TABS
ORAL_TABLET | ORAL | Status: AC
  Administered 2011-12-01: 10 mg via ORAL
  Filled 2011-12-01: qty 2

## 2011-12-01 MED ORDER — TAMSULOSIN HCL 0.4 MG PO CAPS: 0.4000 mg | ORAL_CAPSULE | ORAL | Status: DC

## 2011-12-01 MED ORDER — OXYCODONE HCL 5 MG PO TABS: 10.0000 mg | ORAL_TABLET | ORAL | Status: DC | PRN

## 2011-12-01 MED ORDER — OXYCODONE HCL 5 MG PO TABS
ORAL_TABLET | ORAL | Status: AC
  Administered 2011-12-01: 10 mg via ORAL
  Filled 2011-12-01: qty 2

## 2011-12-01 MED ORDER — SODIUM CHLORIDE 0.45 % IV SOLN
INTRAVENOUS | Status: DC
  Administered 2011-12-01: 14:00:00 via INTRAVENOUS

## 2011-12-01 MED ORDER — DIPHENHYDRAMINE HCL 25 MG PO CAPS
ORAL_CAPSULE | ORAL | Status: AC
  Administered 2011-12-01: 25 mg via ORAL
  Filled 2011-12-01: qty 1

## 2011-12-01 MED ORDER — CIPROFLOXACIN HCL 500 MG PO TABS
ORAL_TABLET | ORAL | Status: AC
  Administered 2011-12-01: 500 mg via ORAL
  Filled 2011-12-01: qty 1

## 2011-12-01 MED ORDER — CIPROFLOXACIN HCL 500 MG PO TABS
500.0000 mg | ORAL_TABLET | ORAL | Status: AC
  Administered 2011-12-01: 500 mg via ORAL

## 2011-12-01 MED ORDER — OXYCODONE HCL 5 MG PO TABS
10.0000 mg | ORAL_TABLET | ORAL | Status: DC | PRN
  Administered 2011-12-01: 10 mg via ORAL

## 2011-12-01 NOTE — Progress Notes (Signed)
Right flank with superficial breakdown about 2cm x 2 cm. No drainage noted.

## 2011-12-01 NOTE — Interval H&P Note (Signed)
History and Physical Interval Note:  12/01/2011 3:47 PM  Carol Miller  has presented today for surgery, with the diagnosis of right ureteral stone   The various methods of treatment have been discussed with the patient and family. After consideration of risks, benefits and other options for treatment, the patient has consented to  Procedure(s): EXTRACORPOREAL SHOCK WAVE LITHOTRIPSY (ESWL) as a surgical intervention .  The patients' history has been reviewed, patient examined, no change in status, stable for surgery.  I have reviewed the patients' chart and labs.  Questions were answered to the patient's satisfaction.     [place]

## 2011-12-01 NOTE — Op Note (Signed)
See [facility] OP note scanned into chart. 

## 2012-01-27 ENCOUNTER — Other Ambulatory Visit (HOSPITAL_COMMUNITY)
Admission: RE | Admit: 2012-01-27 | Discharge: 2012-01-27 | Disposition: A | Discharge status: Home / Self Care | Payer: 59 | Source: Ambulatory Visit | Attending: Family Medicine | Admitting: Family Medicine

## 2012-01-27 ENCOUNTER — Other Ambulatory Visit: Payer: Self-pay | Admitting: Physician Assistant

## 2012-01-27 DIAGNOSIS — Z124 Encounter for screening for malignant neoplasm of cervix: Secondary | ICD-10-CM | POA: Insufficient documentation

## 2014-11-07 HISTORY — PX: COLONOSCOPY: SHX174

## 2015-12-14 ENCOUNTER — Emergency Department (HOSPITAL_COMMUNITY)
Admission: EM | Admit: 2015-12-14 | Discharge: 2015-12-14 | Disposition: A | Discharge status: Home / Self Care | Payer: 59 | Attending: Emergency Medicine | Admitting: Emergency Medicine

## 2015-12-14 ENCOUNTER — Emergency Department (HOSPITAL_COMMUNITY): Payer: 59

## 2015-12-14 ENCOUNTER — Encounter (HOSPITAL_COMMUNITY): Payer: Self-pay | Admitting: Emergency Medicine

## 2015-12-14 DIAGNOSIS — S6992XA Unspecified injury of left wrist, hand and finger(s), initial encounter: Secondary | ICD-10-CM | POA: Diagnosis present

## 2015-12-14 DIAGNOSIS — W231XXA Caught, crushed, jammed, or pinched between stationary objects, initial encounter: Secondary | ICD-10-CM | POA: Diagnosis not present

## 2015-12-14 DIAGNOSIS — Z79899 Other long term (current) drug therapy: Secondary | ICD-10-CM | POA: Diagnosis not present

## 2015-12-14 DIAGNOSIS — Y9389 Activity, other specified: Secondary | ICD-10-CM | POA: Insufficient documentation

## 2015-12-14 DIAGNOSIS — Z880 Allergy status to penicillin: Secondary | ICD-10-CM | POA: Insufficient documentation

## 2015-12-14 DIAGNOSIS — Z87891 Personal history of nicotine dependence: Secondary | ICD-10-CM | POA: Diagnosis not present

## 2015-12-14 DIAGNOSIS — Z87442 Personal history of urinary calculi: Secondary | ICD-10-CM | POA: Insufficient documentation

## 2015-12-14 DIAGNOSIS — Y998 Other external cause status: Secondary | ICD-10-CM | POA: Diagnosis not present

## 2015-12-14 DIAGNOSIS — Y9289 Other specified places as the place of occurrence of the external cause: Secondary | ICD-10-CM | POA: Insufficient documentation

## 2015-12-14 DIAGNOSIS — Z91040 Latex allergy status: Secondary | ICD-10-CM | POA: Diagnosis not present

## 2015-12-14 MED ORDER — OXYCODONE-ACETAMINOPHEN 5-325 MG PO TABS: 1.0000 | ORAL_TABLET | Freq: Once | ORAL | Status: DC

## 2015-12-14 MED ORDER — IBUPROFEN 800 MG PO TABS
800.0000 mg | ORAL_TABLET | Freq: Once | ORAL | Status: AC
  Administered 2015-12-14: 800 mg via ORAL
  Filled 2015-12-14: qty 1

## 2015-12-14 MED ORDER — OXYCODONE-ACETAMINOPHEN 5-325 MG PO TABS: 1.0000 | ORAL_TABLET | ORAL | Status: DC | PRN

## 2015-12-14 MED ORDER — IBUPROFEN 800 MG PO TABS: 800.0000 mg | ORAL_TABLET | Freq: Three times a day (TID) | ORAL | Status: DC

## 2015-12-14 MED ORDER — IBUPROFEN 800 MG PO TABS: 800.0000 mg | ORAL_TABLET | Freq: Once | ORAL | Status: DC

## 2015-12-14 NOTE — ED Provider Notes (Signed)
CSN: [id number]     Arrival date & time 12/14/15  0612 History   First MD Initiated Contact with Patient 12/14/15 [id number]     Chief Complaint  Patient presents with  . Finger Injury     (Consider location/radiation/quality/duration/timing/severity/associated sxs/prior Treatment) HPI   Carol Miller is a 35 y.o. female, patient with no pertinent past medical history, presenting to the ED with a left thumb injury that occurred last night. Pt states she accidentally shut her thumb in the car door. Pt rates her pain at 7/10, describes it as throbbing, radiating into her left arm. Pt has tried ice, elevation, and ibuprofen, with minimal relief. Pt denies other injuries, numbness/tingling/weakness, or any other pain or complaints.     Past Medical History  Diagnosis Date  . Kidney stone     multiple kidney stones but able to pass   Past Surgical History  Procedure Laterality Date  . Tubal ligation  2011   No family history on file. Social History  Substance Use Topics  . Smoking status: Former Smoker -- 15 years    Types: Cigarettes  . Smokeless tobacco: None     Comment: quit smoking 2012  . Alcohol Use: No   OB History    No data available     Review of Systems  Musculoskeletal: Positive for arthralgias (Left thumb pain).      Allergies  Penicillins and Latex  Home Medications   Prior to Admission medications   Medication Sig Start Date End Date Taking? Authorizing Provider  HYDROmorphone (DILAUDID) 4 MG tablet Take 4 mg by mouth every 4 (four) hours as needed. Pain    Historical Provider, MD  ibuprofen (ADVIL,MOTRIN) 800 MG tablet Take 1 tablet (800 mg total) by mouth 3 (three) times daily. 12/14/15   Jshawn Hurta C Tayo Maute, PA-C  oxyCODONE-acetaminophen (PERCOCET/ROXICET) 5-325 MG tablet Take 1-2 tablets by mouth every 4 (four) hours as needed for severe pain. 12/14/15   Rece Zechman C Jacklynn Dehaas, PA-C  promethazine (PHENERGAN) 12.5 MG tablet Take 12.5 mg by mouth every 6 (six) hours as  needed. Nausea    Historical Provider, MD  Tamsulosin HCl (FLOMAX) 0.4 MG CAPS Take 1 capsule (0.4 mg total) by mouth daily after supper. 12/01/11   Ihor Gully, MD  tapentadol (NUCYNTA) 50 MG TABS Take 50 mg by mouth every 6 (six) hours as needed. Pain    Historical Provider, MD  trimethoprim (TRIMPEX) 100 MG tablet Take 100 mg by mouth at bedtime.    Historical Provider, MD   BP 125/94 mmHg  Pulse 67  Temp(Src) 98.4 F (36.9 C) (Oral)  Resp 18  Ht  (1.702 m)  Wt 97.523 kg  BMI 33.67 kg/m2  SpO2 98% Physical Exam  Constitutional: She appears well-developed and well-nourished. No distress.  HENT:  Head: Normocephalic and atraumatic.  Eyes: Conjunctivae are normal. Pupils are equal, round, and reactive to light.  Cardiovascular: Normal rate and regular rhythm.   Pulmonary/Chest: Effort normal.  Musculoskeletal:  Bruising, tenderness and minor swelling noted to left thumb. ROM intact. CMS intact distal to the injury.  Neurological: She is alert. She has normal reflexes.  No sensory deficits. Strength 5/5.  Skin: Skin is dry. She is not diaphoretic.  Nursing note and vitals reviewed.   ED Course  Procedures (including critical care time)  Imaging Review Dg Hand Complete Left  12/14/2015  CLINICAL DATA:  Door closed on hand, pain primarily laterally EXAM: LEFT HAND - COMPLETE 3+ VIEW COMPARISON:  None.  FINDINGS: Frontal, oblique, and lateral views were obtained. There is no demonstrable fracture or dislocation. The joint spaces appear normal. No erosive change. Incidental note is made of a minus ulnar variant. IMPRESSION: No demonstrable fracture or dislocation.  No apparent arthropathy. Electronically Signed   By: Bretta Bang III M.D.   On: 12/14/2015 07:34   I have personally reviewed and evaluated these images as part of my medical decision-making.   EKG Interpretation None      MDM   Final diagnoses:  Thumb injury, left, initial encounter    Charmika N  Graybill presents with left thumb pain following a being closed in a car door yesterday.  Pt states she will be driving home, therefore, only ibuprofen will be administered. X-ray is [place] from fracture or other abnormality. Patient has no neuro or functional deficits. The patient was given instructions for home care as well as return precautions. Patient voices understanding of these instructions, accepts the plan, and is comfortable with discharge.  Anselm Pancoast, PA-C 12/14/15 1610  Raeford Razor, MD 12/15/15 [id number]

## 2015-12-14 NOTE — ED Notes (Signed)
Pt reports that she "accidentally slammed her left thumb in her car door, last night around 5:30pm."  She tried to "wait and see if it felt better but the pain is too great right now."  No other health concerns at this time.

## 2015-12-14 NOTE — Discharge Instructions (Signed)
You have been seen today for a thumb injury. Your imaging showed no abnormalities. Follow up with PCP as needed. Return to ED should symptoms worsen. Use ice and ibuprofen to reduce pain and inflammation.    Emergency Department Resource Guide 1) Find a Doctor and Pay Out of Pocket Although you won't have to find out who is covered by your insurance plan, it is a good idea to ask around and get recommendations. You will then need to call the office and see if the doctor you have chosen will accept you as a new patient and what types of options they offer for patients who are self-pay. Some doctors offer discounts or will set up payment plans for their patients who do not have insurance, but you will need to ask so you aren't surprised when you get to your appointment.  2) Contact Your Local Health Department Not all health departments have doctors that can see patients for sick visits, but many do, so it is worth a call to see if yours does. If you don't know where your local health department is, you can check in your phone book. The CDC also has a tool to help you locate your state's health department, and many state websites also have listings of all of their local health departments.  3) Find a Walk-in Clinic If your illness is not likely to be very severe or complicated, you may want to try a walk in clinic. These are popping up all over the country in pharmacies, drugstores, and shopping centers. They're usually staffed by nurse practitioners or physician assistants that have been trained to treat common illnesses and complaints. They're usually fairly quick and inexpensive. However, if you have serious medical issues or chronic medical problems, these are probably not your best option.  No Primary Care Doctor: - Call Health Connect at  [phone] - they can help you locate a primary care doctor that  accepts your insurance, provides certain services, etc. - Physician Referral Service-  [phone]  Chronic Pain Problems: Organization         Address  Phone   Notes  [facility]  [phone] Patients need to be referred by their primary care doctor.   Medication Assistance: Organization         Address  Phone   Notes  [facility] [address] [place] [place], [place] [address] [phone] --Must be a resident of [facility] -- Must have NO insurance coverage whatsoever (no Medicaid/ Medicare, etc.) -- The pt. MUST have a primary care doctor that directs their care regularly and follows them in the community   [facility]  [phone]   [employer]  [phone]    Agencies that provide inexpensive medical care: Organization         Address  Phone   Notes  [facility]  [phone]   [facility]    [phone]   [facility] [address] [place], [place] [address] [phone]   [facility] [address] [place] [place] [phone]   Planned Parenthood    [phone]   [facility]    [phone]   [facility]  [address], [place] Phone:  [phone], Fax:  [phone] Hours of Operation:  9 am - 6 pm, M-F.  Also accepts Medicaid/Medicare and self-pay.  [place]  Center for [place], [address], [place] Phone: [phone], Fax: [phone]. Hours of Operation:  8:30 am - 5:30 pm, M-F.  Also accepts Medicaid and self-pay.  [facility] [place] [address] [place] Phone: [phone]   [place], [place], [place] [phone], Ext. [phone] Mondays & Thursdays: 7-9 AM.  First 15 patients are seen on a first come, first serve basis.    [place] Providers:  Organization         Address  Phone   Notes  [facility] [address],  [place] [phone] Also accepts self-pay patients.  [facility] [id number] [place], [place]  [phone]   [place], [address], [place] [phone]   [facility] [address] [place] [phone]   Lucianne Lei [address], [place]   [phone] Only accepts [place] Access [place] patients after they have their name applied to their card.   Self-Pay (no insurance) in [facility]:  Organization         Address  Phone   Notes  Sickle Cell Patients, [facility] [place] [phone]   [facility] [place] [phone]   [facility] [place]  [place], [place], [place] [phone]   [facility]/Dr. Osei-Bonsu  [address], [place] or [address], [place] [phone] Phone number for both [place] and [place] locations is the same.  [facility] [address] [place] [phone]   [facility] [address], [place] or [address] [phone]   [facility] [address], [place] [phone], phone; [phone], fax Sees patients 1st and 3rd Saturday of every month.  Must not qualify for public or private insurance (i.e. Medicaid, Medicare, [facility] Health Choice, Veterans' Benefits)  Household income should be no more than 200% of the poverty level The clinic cannot treat you if you are pregnant or think you are pregnant  Sexually transmitted diseases are not treated at the clinic.    Dental Care: Organization         Address  Phone  Notes  [facility] Department of [facility] [place] [phone] Accepts children up to age 102 who are enrolled in  [place] or [place]; pregnant women with a Medicaid card; and children who have applied for Medicaid or [place] Health Choice, but were declined, whose parents can pay a reduced fee at time of service.  [facility] Department of [facility]  [address], [place] [phone] Accepts children up to age 74 who are enrolled in [place] or [place]; pregnant women with a Medicaid card; and children who have applied for Medicaid or [place] Health Choice, but were declined, whose parents can pay a reduced fee at time of service.  [facility]  [place] [phone] Patients are seen by appointment only. Walk-ins are not accepted. [place] will see patients 40 years of age and older. Monday - Tuesday (8am-5pm) Most Wednesdays (8:30-5pm) $30 per visit, cash  only  [facility] Access PRO[facility]  [address], [facility] [phone] Patients are seen by appointment only. Walk-ins are not accepted. [place] will see patients 14 years of age and older. One Wednesday Evening (Monthly: Volunteer Based).  $30 per visit, cash only  [place]  [phone] for adults; Children under age 48, call Graduate Pediatric Dentistry at [phone]. Children aged 39-14, please call [phone] to request a pediatric application.  Dental services are provided in all areas of dental care including fillings, crowns and bridges, complete and partial dentures, implants, gum treatment, root canals, and extractions. Preventive care is also provided. Treatment is provided to both adults and children. Patients are selected via a lottery and there is often a waiting list.   [facility] [address] [place]  [phone] [email or web address]   [facility] [address] [place], [place] [phone], Ext. [phone] Second and Fourth Thursday of each month, opens at 6:30  AM; Clinic ends at 9 AM.  Patients are seen on a first-come first-served basis, and a limited number are seen during each clinic.   [facility]  [address] Hillard Danker [place], [place] [phone]   Eligibility Requirements You must have lived in [place], [place], or [place] counties for at least the last three months.   You cannot be eligible for state or federal sponsored [employer], including [employer], [place], or [employer].   You generally cannot be eligible for healthcare insurance through your employer.    How to apply: Eligibility screenings are held every Tuesday and Wednesday afternoon from 1:00 pm until 4:00 pm. You do not need an appointment for the interview!  [facility] [address], [place], [place]   [place]  [place] Department  [place]  [phone]    Behavioral Health Resources in the Community: Intensive Outpatient Programs Organization         Address  Phone  Notes  [place]. [address], [place], [place] [phone]   [facility] Outpatient [address] [place], [place]   ADS: Alcohol & Drug Svcs [address], [place], [place]   [place] [address],  [place], [place] or [phone]   Substance Abuse Resources Organization         Address  Phone  Notes  Alcohol and Drug Services  [phone]   [place]  [phone]   The [place]   Chinita Pester  [phone]   Residential & Outpatient Substance Abuse Program  [phone]   Psychological Services Organization         Address  Phone  Notes  [facility] [place]  [place]  [phone]   [place] [address], [place] or  [phone]    Mobile Crisis Teams Organization         Address  Phone  Notes  [facility], Mobile Crisis Care Unit  [phone]   [facility]  [address] [place], [place]   [place] Levels [address] [place] [phone]    Self-Help/Support Groups Organization         Address  Phone  Notes  [facility] [place] - variety of support groups  [place] Call for more information  Narcotics Anonymous (NA), [facility] [address], [employer] [place]  2 meetings at this location   Special educational needs teacher         Address  Phone  Notes  ASAP Residential Treatment [place],    [place]  [phone]   [facility]  [address] [place] [id number], [place], [place]   [place], [place] [phone] Admissions: 8am-3pm M-F  Incentives Substance [place] [address]    [place], [place] [phone]   [facility] [address] [place], [place], [place]   [facility] [address],  [place], [place]   [facility] - Intensive Outpatient [address], [place], [place]   [facility] ([place] [address].,  [place], [place] [phone] or [phone]   [facility] Services (RTS) [address] [place], [place] Accepts Medicaid  [facility] [place] [address],  [place] [phone] Substance Abuse/Addiction Treatment   [facility] Organization         Address  Phone  Notes  [facility]  [phone]   Domenic Schwab, PhD [address] Arlis Porta [place], [place]   [phone] or [phone]   [place] and [place], [place] [phone]   [facility] [address],  [place], [place] [phone] Insurance/Medicaid/sponsorship through [facility] and Families [address], [address] [place]                                    [place], [place] [phone] [place] [address], [place] [phone]    Dr. Adele Schilder  [phone]   [facility] Dept. 1) [address], [place] 2) [place] 3)  [place] [address], [place] [phone]  [phone]   [place] [phone] or [phone] (After Hours)

## 2016-05-31 ENCOUNTER — Other Ambulatory Visit: Payer: Self-pay | Admitting: Urology

## 2016-06-06 ENCOUNTER — Encounter (HOSPITAL_BASED_OUTPATIENT_CLINIC_OR_DEPARTMENT_OTHER): Payer: Self-pay | Admitting: *Deleted

## 2016-06-07 ENCOUNTER — Encounter (HOSPITAL_BASED_OUTPATIENT_CLINIC_OR_DEPARTMENT_OTHER): Payer: Self-pay | Admitting: *Deleted

## 2016-06-07 NOTE — Progress Notes (Signed)
NPO AFTER MN.  ARRIVE AT 0600.  NEEDS HG.  MAY TAKE PAIN/ NAUSEA MEDS W/ SIPS OF WATER AM DOS.

## 2016-06-10 ENCOUNTER — Ambulatory Visit (HOSPITAL_BASED_OUTPATIENT_CLINIC_OR_DEPARTMENT_OTHER): Payer: 59 | Admitting: Anesthesiology

## 2016-06-10 ENCOUNTER — Ambulatory Visit (HOSPITAL_BASED_OUTPATIENT_CLINIC_OR_DEPARTMENT_OTHER)
Admission: RE | Admit: 2016-06-10 | Discharge: 2016-06-10 | Disposition: A | Discharge status: Home / Self Care | Payer: 59 | Source: Ambulatory Visit | Attending: Urology | Admitting: Urology

## 2016-06-10 ENCOUNTER — Encounter (HOSPITAL_BASED_OUTPATIENT_CLINIC_OR_DEPARTMENT_OTHER): Payer: Self-pay | Admitting: *Deleted

## 2016-06-10 ENCOUNTER — Encounter (HOSPITAL_BASED_OUTPATIENT_CLINIC_OR_DEPARTMENT_OTHER)
Admission: RE | Disposition: A | Discharge status: Home / Self Care | Payer: Self-pay | Source: Ambulatory Visit | Attending: Urology

## 2016-06-10 DIAGNOSIS — Z6834 Body mass index (BMI) 34.0-34.9, adult: Secondary | ICD-10-CM | POA: Diagnosis not present

## 2016-06-10 DIAGNOSIS — Z87891 Personal history of nicotine dependence: Secondary | ICD-10-CM | POA: Diagnosis not present

## 2016-06-10 DIAGNOSIS — N200 Calculus of kidney: Secondary | ICD-10-CM | POA: Diagnosis present

## 2016-06-10 DIAGNOSIS — N201 Calculus of ureter: Secondary | ICD-10-CM

## 2016-06-10 HISTORY — DX: Hematuria, unspecified: R31.9

## 2016-06-10 HISTORY — DX: Frequency of micturition: R35.0

## 2016-06-10 HISTORY — DX: Calculus of kidney: N20.0

## 2016-06-10 HISTORY — DX: Calculus of ureter: N20.1

## 2016-06-10 HISTORY — DX: Personal history of other diseases of the nervous system and sense organs: Z86.69

## 2016-06-10 HISTORY — DX: Urgency of urination: R39.15

## 2016-06-10 HISTORY — DX: Personal history of urinary calculi: Z87.442

## 2016-06-10 HISTORY — DX: Unspecified asthma, uncomplicated: J45.909

## 2016-06-10 HISTORY — PX: CYSTOSCOPY/URETEROSCOPY/HOLMIUM LASER/STENT PLACEMENT: SHX6546

## 2016-06-10 LAB — POCT HEMOGLOBIN-HEMACUE: Hemoglobin: 13 g/dL (ref 12.0–15.0)

## 2016-06-10 SURGERY — CYSTOSCOPY/URETEROSCOPY/HOLMIUM LASER/STENT PLACEMENT
Anesthesia: General | Site: Ureter | Laterality: Right

## 2016-06-10 MED ORDER — OXYCODONE HCL 5 MG PO TABS
ORAL_TABLET | ORAL | Status: AC
  Filled 2016-06-10: qty 1

## 2016-06-10 MED ORDER — ONDANSETRON HCL 4 MG/2ML IJ SOLN
4.0000 mg | Freq: Once | INTRAMUSCULAR | Status: DC | PRN
  Filled 2016-06-10: qty 2

## 2016-06-10 MED ORDER — LIDOCAINE HCL (CARDIAC) 20 MG/ML IV SOLN
INTRAVENOUS | Status: DC | PRN
  Administered 2016-06-10: 80 mg via INTRAVENOUS

## 2016-06-10 MED ORDER — PHENAZOPYRIDINE HCL 200 MG PO TABS
200.0000 mg | ORAL_TABLET | Freq: Once | ORAL | Status: AC
  Administered 2016-06-10: 200 mg via ORAL
  Filled 2016-06-10: qty 1

## 2016-06-10 MED ORDER — IOHEXOL 300 MG/ML  SOLN
INTRAMUSCULAR | Status: DC | PRN
  Administered 2016-06-10: 17 mL

## 2016-06-10 MED ORDER — OXYCODONE HCL 5 MG PO TABS: 5.0000 mg | ORAL_TABLET | ORAL | 0 refills | Status: DC | PRN

## 2016-06-10 MED ORDER — HYDROMORPHONE HCL 1 MG/ML IJ SOLN
INTRAMUSCULAR | Status: AC
  Filled 2016-06-10: qty 1

## 2016-06-10 MED ORDER — DEXAMETHASONE SODIUM PHOSPHATE 4 MG/ML IJ SOLN
INTRAMUSCULAR | Status: DC | PRN
  Administered 2016-06-10: 10 mg via INTRAVENOUS

## 2016-06-10 MED ORDER — OXYCODONE HCL 5 MG/5ML PO SOLN
5.0000 mg | Freq: Once | ORAL | Status: AC | PRN
  Filled 2016-06-10: qty 5

## 2016-06-10 MED ORDER — ONDANSETRON HCL 4 MG/2ML IJ SOLN
INTRAMUSCULAR | Status: DC | PRN
  Administered 2016-06-10: 4 mg via INTRAVENOUS

## 2016-06-10 MED ORDER — STERILE WATER FOR IRRIGATION IR SOLN
Status: DC | PRN
  Administered 2016-06-10: 500 mL

## 2016-06-10 MED ORDER — LIDOCAINE HCL 2 % EX GEL
CUTANEOUS | Status: DC | PRN
  Administered 2016-06-10: 1

## 2016-06-10 MED ORDER — PHENAZOPYRIDINE HCL 100 MG PO TABS
ORAL_TABLET | ORAL | Status: AC
  Filled 2016-06-10: qty 2

## 2016-06-10 MED ORDER — MIDAZOLAM HCL 2 MG/2ML IJ SOLN
INTRAMUSCULAR | Status: AC
  Filled 2016-06-10: qty 2

## 2016-06-10 MED ORDER — OXYCODONE HCL 5 MG PO TABS
5.0000 mg | ORAL_TABLET | Freq: Once | ORAL | Status: AC
  Administered 2016-06-10: 5 mg via ORAL
  Filled 2016-06-10: qty 1

## 2016-06-10 MED ORDER — FENTANYL CITRATE (PF) 100 MCG/2ML IJ SOLN
INTRAMUSCULAR | Status: AC
  Filled 2016-06-10: qty 2

## 2016-06-10 MED ORDER — PROPOFOL 10 MG/ML IV BOLUS
INTRAVENOUS | Status: AC
  Filled 2016-06-10: qty 40

## 2016-06-10 MED ORDER — ARTIFICIAL TEARS OP OINT
TOPICAL_OINTMENT | OPHTHALMIC | Status: AC
  Filled 2016-06-10: qty 3.5

## 2016-06-10 MED ORDER — LIDOCAINE HCL (CARDIAC) 20 MG/ML IV SOLN
INTRAVENOUS | Status: AC
  Filled 2016-06-10: qty 5

## 2016-06-10 MED ORDER — PHENAZOPYRIDINE HCL 200 MG PO TABS: 200.0000 mg | ORAL_TABLET | Freq: Three times a day (TID) | ORAL | 0 refills | Status: DC | PRN

## 2016-06-10 MED ORDER — CIPROFLOXACIN HCL 500 MG PO TABS: 500.0000 mg | ORAL_TABLET | Freq: Once | ORAL | 0 refills | Status: AC

## 2016-06-10 MED ORDER — BELLADONNA ALKALOIDS-OPIUM 16.2-60 MG RE SUPP
RECTAL | Status: DC | PRN
  Administered 2016-06-10: 1 via RECTAL

## 2016-06-10 MED ORDER — PROPOFOL 10 MG/ML IV BOLUS
INTRAVENOUS | Status: DC | PRN
  Administered 2016-06-10: 200 mg via INTRAVENOUS

## 2016-06-10 MED ORDER — OXYCODONE HCL 5 MG PO TABS
5.0000 mg | ORAL_TABLET | Freq: Once | ORAL | Status: AC | PRN
  Administered 2016-06-10: 5 mg via ORAL
  Filled 2016-06-10: qty 1

## 2016-06-10 MED ORDER — HYDROMORPHONE HCL 1 MG/ML IJ SOLN
0.2500 mg | INTRAMUSCULAR | Status: DC | PRN
  Administered 2016-06-10 (×3): 0.5 mg via INTRAVENOUS
  Filled 2016-06-10: qty 1

## 2016-06-10 MED ORDER — FENTANYL CITRATE (PF) 100 MCG/2ML IJ SOLN
INTRAMUSCULAR | Status: DC | PRN
  Administered 2016-06-10 (×2): 50 ug via INTRAVENOUS
  Administered 2016-06-10: 25 ug via INTRAVENOUS
  Administered 2016-06-10: 50 ug via INTRAVENOUS
  Administered 2016-06-10: 25 ug via INTRAVENOUS

## 2016-06-10 MED ORDER — SODIUM CHLORIDE 0.9 % IR SOLN
Status: DC | PRN
  Administered 2016-06-10: 4000 mL

## 2016-06-10 MED ORDER — ONDANSETRON HCL 4 MG/2ML IJ SOLN
INTRAMUSCULAR | Status: AC
  Filled 2016-06-10: qty 2

## 2016-06-10 MED ORDER — KETOROLAC TROMETHAMINE 30 MG/ML IJ SOLN
INTRAMUSCULAR | Status: DC | PRN
  Administered 2016-06-10: 30 mg via INTRAVENOUS

## 2016-06-10 MED ORDER — KETOROLAC TROMETHAMINE 30 MG/ML IJ SOLN
INTRAMUSCULAR | Status: AC
  Filled 2016-06-10: qty 1

## 2016-06-10 MED ORDER — BELLADONNA ALKALOIDS-OPIUM 16.2-60 MG RE SUPP
RECTAL | Status: AC
  Filled 2016-06-10: qty 1

## 2016-06-10 MED ORDER — DEXAMETHASONE SODIUM PHOSPHATE 10 MG/ML IJ SOLN
INTRAMUSCULAR | Status: AC
  Filled 2016-06-10: qty 1

## 2016-06-10 MED ORDER — CIPROFLOXACIN IN D5W 400 MG/200ML IV SOLN
INTRAVENOUS | Status: AC
  Filled 2016-06-10: qty 200

## 2016-06-10 MED ORDER — MIDAZOLAM HCL 5 MG/5ML IJ SOLN
INTRAMUSCULAR | Status: DC | PRN
  Administered 2016-06-10: 2 mg via INTRAVENOUS

## 2016-06-10 MED ORDER — CIPROFLOXACIN IN D5W 400 MG/200ML IV SOLN
400.0000 mg | INTRAVENOUS | Status: AC
  Administered 2016-06-10: 400 mg via INTRAVENOUS
  Filled 2016-06-10: qty 200

## 2016-06-10 MED ORDER — LACTATED RINGERS IV SOLN
INTRAVENOUS | Status: DC
  Administered 2016-06-10 (×2): via INTRAVENOUS
  Filled 2016-06-10: qty 1000

## 2016-06-10 MED ORDER — MEPERIDINE HCL 25 MG/ML IJ SOLN
6.2500 mg | INTRAMUSCULAR | Status: DC | PRN
  Filled 2016-06-10: qty 1

## 2016-06-10 SURGICAL SUPPLY — 42 items
ADAPTER CATH URET PLST 4-6FR (CATHETERS) IMPLANT
ADPR CATH URET STRL DISP 4-6FR (CATHETERS)
BAG DRAIN URO-CYSTO SKYTR STRL (DRAIN) ×3 IMPLANT
BAG DRN UROCATH (DRAIN) ×1
BASKET LASER NITINOL 1.9FR (BASKET) IMPLANT
BASKET STNLS GEMINI 4WIRE 3FR (BASKET) IMPLANT
BASKET STONE 1.7 NGAGE (UROLOGICAL SUPPLIES) ×3 IMPLANT
BASKET ZERO TIP NITINOL 2.4FR (BASKET) IMPLANT
BSKT STON RTRVL 120 1.9FR (BASKET)
BSKT STON RTRVL GEM 120X11 3FR (BASKET)
BSKT STON RTRVL ZERO TP 2.4FR (BASKET)
CATH INTERMIT  6FR 70CM (CATHETERS) IMPLANT
CATH URET 5FR 28IN CONE TIP (BALLOONS)
CATH URET 5FR 28IN OPEN ENDED (CATHETERS) ×3 IMPLANT
CATH URET 5FR 70CM CONE TIP (BALLOONS) IMPLANT
CATH URET DUAL LUMEN 6-10FR 50 (CATHETERS) IMPLANT
CLOTH BEACON ORANGE TIMEOUT ST (SAFETY) ×3 IMPLANT
DRSG TEGADERM 2-3/8X2-3/4 SM (GAUZE/BANDAGES/DRESSINGS) IMPLANT
FIBER LASER LITHO 273 (Laser) ×3 IMPLANT
GLOVE INDICATOR 7.5 STRL GRN (GLOVE) ×6 IMPLANT
GOWN STRL REUS W/TWL LRG LVL3 (GOWN DISPOSABLE) ×6 IMPLANT
GUIDEWIRE 0.038 PTFE COATED (WIRE) IMPLANT
GUIDEWIRE ANG ZIPWIRE 038X150 (WIRE) IMPLANT
GUIDEWIRE STR DUAL SENSOR (WIRE) ×6 IMPLANT
IV NS 1000ML (IV SOLUTION) ×3
IV NS 1000ML BAXH (IV SOLUTION) ×1 IMPLANT
IV NS IRRIG 3000ML ARTHROMATIC (IV SOLUTION) ×3 IMPLANT
KIT BALLIN UROMAX 15FX10 (LABEL) IMPLANT
KIT BALLN UROMAX 15FX4 (MISCELLANEOUS) IMPLANT
KIT BALLN UROMAX 26 75X4 (MISCELLANEOUS)
KIT ROOM TURNOVER WOR (KITS) ×3 IMPLANT
LASER FIBER ×3 IMPLANT
MANIFOLD NEPTUNE II (INSTRUMENTS) IMPLANT
NS IRRIG 500ML POUR BTL (IV SOLUTION) IMPLANT
PACK CYSTO (CUSTOM PROCEDURE TRAY) ×3 IMPLANT
SET HIGH PRES BAL DIL (LABEL)
SHEATH ACCESS URETERAL 38CM (SHEATH) ×3 IMPLANT
STENT URET 6FRX26 CONTOUR (STENTS) ×3 IMPLANT
TUBE CONNECTING 12'X1/4 (SUCTIONS) ×1
TUBE CONNECTING 12X1/4 (SUCTIONS) ×2 IMPLANT
TUBE FEEDING 8FR 16IN STR KANG (MISCELLANEOUS) IMPLANT
WATER STERILE IRR 500ML POUR (IV SOLUTION) ×3 IMPLANT

## 2016-06-10 NOTE — Transfer of Care (Signed)
Immediate Anesthesia Transfer of Care Note  Patient: Carol Miller  Procedure(s) Performed: Procedure(s): CYSTOSCOPY/URETEROSCOPY/HOLMIUM LASER/STENT PLACEMENT (Right)  Patient Location: PACU  Anesthesia Type:General  Level of Consciousness: awake, alert , oriented and patient cooperative  Airway & Oxygen Therapy: Patient Spontanous Breathing and Patient connected to nasal cannula oxygen  Post-op Assessment: Report given to RN and Post -op Vital signs reviewed and stable  Post vital signs: Reviewed and stable  Last Vitals:  Vitals:   06/10/16 0628 06/10/16 0911  BP: 109/69 (P) 127/78  Pulse: 69 (P) 78  Resp: 10 (P) 12  Temp: 36.7 C (P) 36.9 C    Last Pain:  Vitals:   06/10/16 0642  TempSrc:   PainSc: 4       Patients Stated Pain Goal: 10 (06/10/16 5110)  Complications: No apparent anesthesia complications

## 2016-06-10 NOTE — Anesthesia Preprocedure Evaluation (Addendum)
Anesthesia Evaluation  Patient identified by MRN, date of birth, ID band Patient awake    Reviewed: Allergy & Precautions, NPO status , Patient's Chart, lab work & pertinent test results  Airway Mallampati: I  TM Distance: >3 FB Neck ROM: Full    Dental  (+) Teeth Intact, Dental Advisory Given   Pulmonary asthma , former smoker,  Hx Asthma w/o need for rescue inhaler.   Pulmonary exam normal breath sounds clear to auscultation       Cardiovascular Exercise Tolerance: Good Normal cardiovascular exam Rhythm:Regular Rate:Normal     Neuro/Psych negative neurological ROS  negative psych ROS   GI/Hepatic negative GI ROS, Neg liver ROS,   Endo/Other  negative endocrine ROSMorbid obesity  Renal/GU Renal diseaseRight Ureteral stone  negative genitourinary   Musculoskeletal negative musculoskeletal ROS (+)   Abdominal   Peds negative pediatric ROS (+)  Hematology negative hematology ROS (+)   Anesthesia Other Findings   Reproductive/Obstetrics negative OB ROS                           Anesthesia Physical Anesthesia Plan  ASA: II  Anesthesia Plan: General   Post-op Pain Management:    Induction: Intravenous  Airway Management Planned: LMA  Additional Equipment:   Intra-op Plan:   Post-operative Plan: Extubation in OR  Informed Consent: I have reviewed the patients History and Physical, chart, labs and discussed the procedure including the risks, benefits and alternatives for the proposed anesthesia with the patient or authorized representative who has indicated his/her understanding and acceptance.   Dental advisory given  Plan Discussed with: CRNA, Anesthesiologist and Surgeon  Anesthesia Plan Comments:         Anesthesia Quick Evaluation

## 2016-06-10 NOTE — Anesthesia Postprocedure Evaluation (Signed)
Anesthesia Post Note  Patient: Amos N Nakayama  Procedure(s) Performed: Procedure(s) (LRB): CYSTOSCOPY/URETEROSCOPY/HOLMIUM LASER/STENT PLACEMENT (Right)  Patient location during evaluation: PACU Anesthesia Type: General Level of consciousness: awake and alert Pain management: pain level controlled Vital Signs Assessment: post-procedure vital signs reviewed and stable Respiratory status: spontaneous breathing, nonlabored ventilation and respiratory function stable Cardiovascular status: blood pressure returned to baseline and stable Postop Assessment: no signs of nausea or vomiting Anesthetic complications: no    Last Vitals:  Vitals:   06/10/16 0930 06/10/16 0936  BP: 121/75   Pulse: 68 66  Resp: 12 12  Temp:      Last Pain:  Vitals:   06/10/16 0936  TempSrc:   PainSc: 5                  Kenlei Safi A

## 2016-06-10 NOTE — Anesthesia Procedure Notes (Addendum)
Procedure Name: LMA Insertion Date/Time: 06/10/2016 7:53 AM Performed by: Tyrone Nine Pre-anesthesia Checklist: Patient identified, Emergency Drugs available, Suction available, Patient being monitored and Timeout performed Patient Re-evaluated:Patient Re-evaluated prior to inductionPreoxygenation: Pre-oxygenation with 100% oxygen Intubation Type: IV induction Ventilation: Mask ventilation without difficulty LMA: LMA inserted LMA Size: 4.0 Number of attempts: 1 Airway Equipment and Method: Bite block Placement Confirmation: positive ETCO2 and breath sounds checked- equal and bilateral Tube secured with: Tape Dental Injury: Teeth and Oropharynx as per pre-operative assessment

## 2016-06-10 NOTE — H&P (Signed)
Acute Kidney Stone HPI: Carol Miller is a 35 year-old female patient who was referred by Misty Stanley L. Andria Meuse, PA who is here for further eval and management of kidney stones.  The patient [facility] with symptoms of a kidney stone.   Her pain started about approximately 05/23/2016. The pain is on the right side.   Abdomen/Pelvic CT: CT scan in 2013. The patient underwent no imaging prior to today's appointment.   The patient relates initially having nausea, vomiting, flank pain, and voiding symptoms. She is currently having flank pain, groin pain, nausea, and chills. She denies having back pain, vomiting, and fever. She has been taking Cipro 500mg  BID x 7 days. She has not caught a stone in her urine strainer since her symptoms began.   She has had ESWL for treatment of her stones in the past. This is not her first kidney stone. Her first stone was approximately 05/08/2011. She has had 1 stones prior to getting this one.    ALLERGIES: Penicillins   MEDICATIONS: Ciprofloxacin Hcl 500 mg tablet    GU PSH: Renal ESWL - 2013     PSH Notes: Lithotripsy, Tubal Ligation  NON-GU PSH: Tubal Ligation - 2013      GU PMH: Calculus Ureter, Proximal Ureteral Stone On The Right - 2014 Kidney Stone, Bilateral kidney stones - 2014 Renal Cysts, Simple, Renal cyst, acquired - 2014     PMH Notes:  1898-11-07 00:00:00 - Note: Normal Routine History And Physical Adult  2011-11-25 11:06:58 - Note: Seizure  NON-GU PMH: Unspecified asthma, uncomplicated, Asthma - 2014   FAMILY HISTORY: Diabetes - Runs In Family nephrolithiasis - Runs In Family  SOCIAL HISTORY: Marital Status: Married Current Smoking Status: Patient has never smoked.  Has never drank.  Drinks 2 caffeinated drinks per day. Patient's occupation Architect.    Notes: Marital History - Currently Married, Occupation:, Caffeine Use, Never A Smoker, Alcohol Use  REVIEW OF SYSTEMS:    GU Review Female:  Patient denies  frequent urination, hard to postpone urination, burning /pain with urination, get up at night to urinate, leakage of urine, stream starts and stops, trouble starting your stream, have to strain to urinate, and currently pregnant.   Gastrointestinal (Upper):  Patient denies nausea, vomiting, and indigestion/ heartburn.   Gastrointestinal (Lower):  Patient denies diarrhea and constipation.   Constitutional:  Patient denies fever, night sweats, weight loss, and fatigue.   Skin:  Patient denies skin rash/ lesion and itching.   Eyes:  Patient denies double vision and blurred vision.   Ears/ Nose/ Throat:  Patient denies sore throat and sinus problems.   Hematologic/Lymphatic:  Patient denies swollen glands and easy bruising.   Cardiovascular:  Patient denies leg swelling and chest pains.   Respiratory:  Patient denies cough and shortness of breath.   Endocrine:  Patient denies excessive thirst.   Musculoskeletal:  Patient denies back pain and joint pain.   Neurological:  Patient denies headaches and dizziness.   Psychologic:  Patient denies depression and anxiety.   VITAL SIGNS: None    MULTI-SYSTEM PHYSICAL EXAMINATION:     Constitutional: Well-nourished. No physical deformities. Normally developed. Good grooming.    Neck: Neck symmetrical, not swollen. Normal tracheal position.    Respiratory: No labored breathing, no use of accessory muscles.     Cardiovascular: Normal temperature, normal extremity pulses, no swelling, no varicosities. RRR    Lymphatic: No enlargement of neck, axillae, groin.    Skin: No paleness, no  jaundice, no cyanosis. No lesion, no ulcer, no rash.    Neurologic / Psychiatric: Oriented to time, oriented to place, oriented to person. No depression, no anxiety, no agitation.    Gastrointestinal: No mass, no tenderness, no rigidity, non obese abdomen.    Eyes: Normal conjunctivae. Normal eyelids.    Ears, Nose, Mouth, and Throat: Left ear no scars, no lesions, no masses.  Right ear no scars, no lesions, no masses. Nose no scars, no lesions, no masses. Normal hearing. Normal lips.    Musculoskeletal: Normal gait and station of head and neck.          PAST DATA REVIEWED:  Source Of History:  Patient Records Review:  Previous Doctor Records, Previous Hospital Records, Previous Patient Records Urine Test Review:  Urinalysis X-Ray Review: C.T. Abdomen/Pelvis: Reviewed Films. Reviewed Report. Discussed With Radiologist. Discussed With Patient.    PROCEDURES:   C.T. ABD-Pelv w/o - [id number]      Ketoralac  - [id number], [id number]  Qty: 60 Adm. By: Julien Nordmann Unit: mg Lot No [id number] Route: IM Exp. Date 01/04/2017 Freq: None Mfgr.:  Site: Left Buttock  ASSESSMENT:    ICD-10 Details 1 GU:  Calculus Kidney and Ureter - N20.2   PLAN:  Medications  New Meds: Oxycodone Hcl 5 mg tablet 1-2 tablet PO Q 6 H #20 0 Refill(s)  Zofran 4 mg tablet 1 tablet PO Q 4 H #20 1 Refill(s)   Orders  Labs Urine Culture and Sensitivity, Urine Preg. X-Rays: CT ABDOMEN AND PELVIS WITHOUT CONTRAST Without - Stone protocol X-Ray Notes: 05/30/16 History:  Hematuria: Yes/No  Patient to see MD after exam: Yes/No  Previous exam: CT / IVP/ US/ KUB/ None  When:  Where:  Diabetic: Yes/ No  BUN/ Creatinine:  Date of last BUN Creatinine:  Weight in pounds:  Allergy- IV Contrast: Yes/ No  Conflicting diabetic meds: Yes/ No  Diabetic Meds:  Prior Authorization #: [id number] Schedule   Document  Letter(s):  Created for Patient: Clinical Summary I went over the treatment options for their stone. We discussed ongoing medical expulsion therapy, ESWL and ureteroscopy. Ultimately the patient and I agreed that ureterscopy is the best option. I went over this surgery with the patient in detail. The patient understands after being put to sleep, we would proceed with a telescope to access the stone and potentially use a laser to fragment the stone before removing it  with a basket. After removing the stone the patient will require temporary stent placement in the ureter. This is an outpatient procedure. I also discussed the potential of not being able to gain access safely into the ureter/kidney. This would require that a stent be placed and then the patient rescheduled several weeks later for a second attempt. They also understand the small risks of ureteral trauma causing a stricture or permanent damage. I also explained the risk of urinary tract infection. Having gone over the procedure itself, the expected outcome, and the risks/benefits the patient has agreed to proceed.

## 2016-06-10 NOTE — Discharge Instructions (Signed)
°  Post Anesthesia Home Care Instructions  Activity: Get plenty of rest for the remainder of the day. A responsible adult should stay with you for 24 hours following the procedure.  For the next 24 hours, DO NOT: -Drive a car -Advertising copywriter -Drink alcoholic beverages -Take any medication unless instructed by your physician -Make any legal decisions or sign important papers.  Meals: Start with liquid foods such as gelatin or soup. Progress to regular foods as tolerated. Avoid greasy, spicy, heavy foods. If nausea and/or vomiting occur, drink only clear liquids until the nausea and/or vomiting subsides. Call your physician if vomiting continues.  Special Instructions/Symptoms: Your throat may feel dry or sore from the anesthesia or the breathing tube placed in your throat during surgery. If this causes discomfort, gargle with warm salt water. The discomfort should disappear within 24 hours.  If you had a scopolamine patch placed behind your ear for the management of post- operative nausea and/or vomiting:  1. The medication in the patch is effective for 72 hours, after which it should be removed.  Wrap patch in a tissue and discard in the trash. Wash hands thoroughly with soap and water. 2. You may remove the patch earlier than 72 hours if you experience unpleasant side effects which may include dry mouth, dizziness or visual disturbances. 3. Avoid touching the patch. Wash your hands with soap and water after contact with the patch.   DISCHARGE INSTRUCTIONS FOR KIDNEY STONE/URETERAL STENT   MEDICATIONS:  1.  Resume all your other meds from home - except do not take any extra narcotic pain meds that you may have at home.  2. Pyridium is to help with the burning/stinging when you urinate. 3. Oxycodone is for moderate/severe pain, otherwise taking upto 1000 mg every 6 hours of plainTylenol will help treat your pain.   5. Take Cipro one hour prior to removal of your stent.   ACTIVITY:    1. No strenuous activity x 1week  2. No driving while on narcotic pain medications  3. Drink plenty of water  4. Continue to walk at home - you can still get blood clots when you are at home, so keep active, but don't over do it.  5. May return to work/school tomorrow or when you feel ready   BATHING:  1. You can shower and we recommend daily showers  2. You have a string coming from your urethra: The stent string is attached to your ureteral stent. Do not pull on this.   SIGNS/SYMPTOMS TO CALL:  Please call us if you have a fever greater than 101.5, uncontrolled nausea/vomiting, uncontrolled pain, dizziness, unable to urinate, bloody urine, chest pain, shortness of breath, leg swelling, leg pain, redness around wound, drainage from wound, or any other concerns or questions.   You can reach [place] at [phone].   FOLLOW-UP:  1. You have an appointment in 6 weeks with a ultrasound of your kidneys prior.   2. You have a string attached to your stent, you may remove it on Wednesday, August 9th. To do this, pull the strings until the stents are completely removed. You may feel an odd sensation in your back.

## 2016-06-10 NOTE — Op Note (Signed)
Preoperative diagnosis: right renal calculus  Postoperative diagnosis: right renal calculus  Procedure:  1. Cystoscopy 2. right ureteroscopy and stone removal 3. Ureteroscopic laser lithotripsy 4. right 31F x 26 ureteral stent placement  5. right retrograde pyelography with interpretation  Surgeon: Crist Fat, MD  Anesthesia: General  Complications: None  Intraoperative findings: right retrograde pyelography demonstrated a normal caliber right ureter with no filling defect or abnormality. The renal pelvis demonstrated sharp calyces, no hydronephrosis, and with no appreciable filling defect.  EBL: Minimal  Specimens: 1. right renal calculus  Disposition of specimens: [facility] for stone analysis  Indication: Carol Miller is a 35 y.o.   patient with right-sided urolithiasis and flank pain. After reviewing the management options for treatment, the patient elected to proceed with the above surgical procedure(s). We have discussed the potential benefits and risks of the procedure, side effects of the proposed treatment, the likelihood of the patient achieving the goals of the procedure, and any potential problems that might occur during the procedure or recuperation. Informed consent has been obtained.   Description of procedure:  The patient was taken to the operating room and general anesthesia was induced.  The patient was placed in the dorsal lithotomy position, prepped and draped in the usual sterile fashion, and preoperative antibiotics were administered. A preoperative time-out was performed.   Cystourethroscopy was performed.  The patient's urethra was examined and was normal.  The bladder was then systematically examined in its entirety. There was no evidence for any bladder tumors, stones, or other mucosal pathology.    Attention then turned to the right ureteral orifice and a ureteral catheter was used to intubate the ureteral orifice.   Omnipaque contrast was injected through the ureteral catheter and a retrograde pyelogram was performed with findings as dictated above.  A 0.38 sensor guidewire was then advanced up the right ureter into the renal pelvis under fluoroscopic guidance. The 6 Fr semirigid ureteroscope was then advanced into the ureter next to the guidewire up to the renal pelvis with no significant ureteral abnormality noted and no stone encountered.  I then advanced a second wire through the semirigid ureteroscope slowly backed the scope out under visual guidance.   I then advanced a 12/14 French ureteroscopic access sheath over the second wire and under fluoroscopic guidance advanced into the proximal ureter. The inner sheath was then removed along with the second wire.  I then advanced a flexible ureteroscope through the access sheath and into the renal pelvis. Pyeloscopy was then performed and the stone encountered.  The stone was then fragmented with the 200 micron holmium laser fiber on a setting of 0.6 and frequency of 6 Hz.   All stones were then removed from the ureter with an N-gage nitinol basket. Any residual stone fragments within the kidney were then lasered with the settings of 50 Hz and point 2 J so as to "dust" the remaining fragments into a very small and easily passable size. Reinspection of the renal pelvis revealed no remaining large visible stones or fragments.   The flexible ureteroscope was then slowly backed out of the kidney and ureter removing the access sheath simultaneously. Visual inspection of the ureter demonstrated no significant ureteral trauma.  The wire was then backloaded through the cystoscope and a ureteral stent was advance over the wire using Seldinger technique.  The stent was positioned appropriately under fluoroscopic and cystoscopic guidance.  The wire was then removed with an adequate stent curl noted in the  renal pelvis as well as in the bladder.  The bladder was then emptied  and the procedure ended.  The patient appeared to tolerate the procedure well and without complications.  The patient was able to be awakened and transferred to the recovery unit in satisfactory condition.   Disposition: The tether of the stent was left on and tucked inside the patient's vagina.  Instructions for removing the stent have been provided to the patient. This has been scheduled for followup in 6 weeks with a renal ultrasound.

## 2016-06-14 ENCOUNTER — Encounter (HOSPITAL_BASED_OUTPATIENT_CLINIC_OR_DEPARTMENT_OTHER): Payer: Self-pay | Admitting: Urology

## 2017-05-07 ENCOUNTER — Encounter (HOSPITAL_COMMUNITY): Payer: Self-pay | Admitting: Emergency Medicine

## 2017-05-07 ENCOUNTER — Ambulatory Visit (HOSPITAL_COMMUNITY)
Admission: EM | Admit: 2017-05-07 | Discharge: 2017-05-07 | Disposition: A | Discharge status: Home / Self Care | Payer: BLUE CROSS/BLUE SHIELD | Attending: Internal Medicine | Admitting: Internal Medicine

## 2017-05-07 DIAGNOSIS — L259 Unspecified contact dermatitis, unspecified cause: Secondary | ICD-10-CM

## 2017-05-07 DIAGNOSIS — B0089 Other herpesviral infection: Secondary | ICD-10-CM

## 2017-05-07 MED ORDER — TRIAMCINOLONE ACETONIDE 0.1 % EX CREA: 1.0000 "application " | TOPICAL_CREAM | Freq: Two times a day (BID) | CUTANEOUS | 0 refills | Status: DC

## 2017-05-07 MED ORDER — PREDNISONE 10 MG (21) PO TBPK: ORAL_TABLET | ORAL | 0 refills | Status: DC

## 2017-05-07 MED ORDER — VALACYCLOVIR HCL 1 G PO TABS: 1000.0000 mg | ORAL_TABLET | Freq: Two times a day (BID) | ORAL | 0 refills | Status: AC

## 2017-05-07 NOTE — ED Triage Notes (Signed)
[facility] with a complaint of a rash on her right leg and left foot x 3 days.

## 2017-05-07 NOTE — ED Provider Notes (Signed)
CSN: [id number]     Arrival date & time 05/07/17  1339 History   First MD Initiated Contact with Patient 05/07/17 1456     Chief Complaint  Patient presents with  . Rash   (Consider location/radiation/quality/duration/timing/severity/associated sxs/prior Treatment) The history is provided by the spouse.  Rash  Location:  Toe Toe rash location:  L third toe Quality: blistering, painful, redness and weeping   Pain details:    Quality:  Tingling, burning and stinging   Severity:  Moderate   Onset quality:  Gradual   Duration:  3 days   Timing:  Constant   Progression:  Worsening Chronicity:  New Context: not animal contact, not eggs, not exposure to similar rash, not food, not insect bite/sting, not plant contact, not sick contacts and not sun exposure   Relieved by:  Nothing Ineffective treatments:  Anti-itch cream Associated symptoms: no fatigue, no fever, no induration, no nausea, no sore throat, no URI and not vomiting     Past Medical History:  Diagnosis Date  . Asthma   . Frequency of urination   . Hematuria   . History of kidney stones   . History of seizures as a child    NONE SINCE PER PT  . Nephrolithiasis   . Right ureteral stone   . Urgency of urination    Past Surgical History:  Procedure Laterality Date  . CERVICAL CERCLAGE  10/29/2009  . COLONOSCOPY  2016  . CYSTOSCOPY/URETEROSCOPY/HOLMIUM LASER/STENT PLACEMENT Right 06/10/2016   Procedure: CYSTOSCOPY/URETEROSCOPY/HOLMIUM LASER/STENT PLACEMENT;  Surgeon: Crist Fat, MD;  [facility];  Service: Urology;  Laterality: Right;  . EXTRACORPOREAL SHOCK WAVE LITHOTRIPSY Right 12/01/2011  . LAPAROSCOPIC TUBAL LIGATION Bilateral 05/14/2010   w/ fulgeration  . TONSILLECTOMY  as child  . TUBAL LIGATION     History reviewed. No pertinent family history. Social History  Substance Use Topics  . Smoking status: Former Smoker    Years: 15.00    Types: Cigarettes    Quit date: 06/08/2011    . Smokeless tobacco: Never Used  . Alcohol use No   OB History    No data available     Review of Systems  Constitutional: Negative for chills, fatigue and fever.  HENT: Negative for congestion, sinus pain, sinus pressure and sore throat.   Respiratory: Negative.   Cardiovascular: Negative for chest pain and palpitations.  Gastrointestinal: Negative for constipation, nausea and vomiting.  Musculoskeletal: Negative.   Skin: Positive for rash.  Neurological: Negative.     Allergies  Penicillins and Latex  Home Medications   Prior to Admission medications   Medication Sig Start Date End Date Taking? Authorizing Provider  predniSONE (STERAPRED UNI-PAK 21 TAB) 10 MG (21) TBPK tablet Take 6 tablets tomorrow, decrease by 1 each day till finished (6,5,4,3,2,1) 05/07/17   Dorena Bodo, NP  triamcinolone cream (KENALOG) 0.1 % Apply 1 application topically 2 (two) times daily. 05/07/17   Dorena Bodo, NP  valACYclovir (VALTREX) 1000 MG tablet Take 1 tablet (1,000 mg total) by mouth 2 (two) times daily. 05/07/17 05/21/17  Dorena Bodo, NP   Meds Ordered and Administered this Visit  Medications - No data to display  BP 121/82 (BP Location: Right Arm)   Pulse 66   Temp 98.7 F (37.1 C) (Oral)   Resp 20   SpO2 100%  No data found.   Physical Exam  Constitutional: She is oriented to person, place, and time. She appears well-developed and well-nourished. No distress.  HENT:  Head: Normocephalic and atraumatic.  Right Ear: External ear normal.  Left Ear: External ear normal.  Eyes: Conjunctivae are normal.  Neck: Normal range of motion.  Musculoskeletal:       Feet:  Neurological: She is alert and oriented to person, place, and time.  Skin: Skin is warm and dry. Capillary refill takes less than 2 seconds. Rash noted. Rash is vesicular. She is not diaphoretic. No erythema.  Psychiatric: She has a normal mood and affect. Her behavior is normal.  Nursing note and vitals  reviewed.   Urgent Care Course     Procedures (including critical care time)  Labs Review Labs Reviewed - No data to display  Imaging Review No results found.    MDM   1. Herpetic whitlow   2. Contact dermatitis, unspecified contact dermatitis type, unspecified trigger     Starting valtrex, prednisone, follow up with PCP as needed.    Dorena BodoKennard, Kanye Depree, NP 05/07/17 1511

## 2017-05-07 NOTE — Discharge Instructions (Signed)
Your rashes are two different diseases. The rash on your toe is herpetic whitlow. I have prescribed valtrex, take 1 tablet twice a day for 1 week. For pain I have given a steroid taper, take as directed.  For the rash on your leg, this is most likely a contact dermatitis. The steroid taper should help and I have prescribed a topical cream to apply to it twice daily as well. Follow up with your pcp in one week as needed or return to clinic

## 2018-06-23 ENCOUNTER — Ambulatory Visit: Payer: Self-pay | Admitting: Nurse Practitioner

## 2018-06-23 VITALS — BP 122/70 | HR 82 | Temp 98.3°F | Ht 67.0 in | Wt 243.6 lb

## 2018-06-23 DIAGNOSIS — Z0000 Encounter for general adult medical examination without abnormal findings: Secondary | ICD-10-CM

## 2018-06-23 NOTE — Progress Notes (Signed)
Subjective:  Carol Miller is a 37 y.o. female who presents for basic physical exam.  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  Patient denies any current health related concerns today.  Patient denies any past medical history of heart disease lung disease liver disease kidney disease diabetes, asthma, as she states this was only in childhood, seizures, as these were in childhood as well, or hypertension.  The patient states her immunizations immunizations are up-to-date.  The patient will return in the next few days to provide a copy of her immunization records.  Patient states her menstrual cycle is regular with her LMP starting on 8/13.  The patient's history is below and is up-to-date per the patient.  The patient denies any past history of recreational drug use, or alcohol use.  Patient was a former smoker but has been 7 years since she quit.   Past Medical History:  Diagnosis Date  . Asthma   . Frequency of urination   . Hematuria   . History of kidney stones   . History of seizures as a child    NONE SINCE PER PT  . Nephrolithiasis   . Right ureteral stone   . Urgency of urination     Past Surgical History:  Procedure Laterality Date  . CERVICAL CERCLAGE  10/29/2009  . COLONOSCOPY  2016  . CYSTOSCOPY/URETEROSCOPY/HOLMIUM LASER/STENT PLACEMENT Right 06/10/2016   Procedure: CYSTOSCOPY/URETEROSCOPY/HOLMIUM LASER/STENT PLACEMENT;  Surgeon: Crist FatBenjamin W Herrick, MD;  [facility] [place];  Service: Urology;  Laterality: Right;  . EXTRACORPOREAL SHOCK WAVE LITHOTRIPSY Right 12/01/2011  . LAPAROSCOPIC TUBAL LIGATION Bilateral 05/14/2010   w/ fulgeration  . TONSILLECTOMY  as child  . TUBAL LIGATION      Social History   Tobacco Use  . Smoking status: Former Smoker    Years: 15.00    Types: Cigarettes    Last attempt to quit: 06/08/2011    Years since quitting: 7.0  . Smokeless tobacco: Never Used  Substance Use Topics   . Alcohol use: No  . Drug use: No    Allergies  Allergen Reactions  . Penicillins Rash and Other (See Comments)    Unknown childhood allergy  . Latex Rash    Review of Systems  Constitutional: Negative.   HENT: Negative.   Eyes: Negative.   Respiratory: Negative.   Cardiovascular: Negative.   Gastrointestinal: Negative.   Genitourinary: Negative.   Musculoskeletal: Negative.   Skin: Negative.   Neurological: Negative.   Endo/Heme/Allergies: Negative.   Psychiatric/Behavioral: Negative.     Objective:  BP 122/70 (BP Location: Right Arm, Patient Position: Sitting, Cuff Size: Normal)   Pulse 82   Temp 98.3 F (36.8 C) (Oral)   Ht 5\' 7"  (1.702 m) Comment: stated(machine down)  Wt 243 lb 9.6 oz (110.5 kg)   SpO2 98%   BMI 38.15 kg/m   General Appearance:  Alert, cooperative, no distress, appears stated age  Head:  Normocephalic, without obvious abnormality, atraumatic  Eyes:  PERRL, conjunctiva/corneas clear, EOM's intact, fundi benign, both eyes  Ears:  Normal TM's and external ear canals, both ears  Nose: Nares normal, septum midline,mucosa normal, no drainage or sinus tenderness  Throat: Lips, mucosa, and tongue normal; teeth and gums normal  Neck: Supple, symmetrical, trachea midline, no adenopathy;  thyroid: not enlarged, symmetric, no tenderness/mass/nodules; no carotid bruit or JVD  Back:   Symmetric, no curvature, ROM normal, no CVA tenderness  Lungs:   Clear to auscultation bilaterally, respirations  unlabored  Breasts:  Deferred  Heart:  Regular rate and rhythm, S1 and S2 normal, no murmur, rub, or gallop  Abdomen:   Soft, non-tender, bowel sounds active all four quadrants,  no masses, no organomegaly  Pelvic: Deferred  Extremities: Extremities normal, atraumatic, no cyanosis or edema  Pulses: 2+ and symmetric  Skin: Skin color, texture, turgor normal, no rashes or lesions  Lymph nodes: Cervical, supraclavicular nodes normal  Neurologic: Normal     Assessment:  basic physical exam    Plan:   Exam findings, diagnosis etiology and medication use and indications reviewed with patient. Follow- Up and discharge instructions provided. No emergent/urgent issues found on exam.  The patient's physical exam was completed however the patient was unable to provide her immunization records, hematocrit and hemoglobin, and QuantiFERON results.  The patient was informed that these items need to be verified before we can return the to exam form.  She states that she will drop by on Monday or Tuesday of next week to provide her immunization records, and will follow up once she receives the results to her QuantiFERON test.  The patient understands that these items must be provided and confirmed prior to release of the paperwork she presented with.  Once these items are verified, [facility] may release the physical exam form back to the patient.  Education was provided on preventive care for her age group and health maintenance.  The patient verbalized understanding of information provided and agrees with plan of care (POC), all questions answered.  1. Routine physical examination

## 2018-06-23 NOTE — Patient Instructions (Signed)
Preventive Care 18-39 Years, Female Preventive care refers to lifestyle choices and visits with your health care provider that can promote health and wellness. What does preventive care include?  A yearly physical exam. This is also called an annual well check.  Dental exams once or twice a year.  Routine eye exams. Ask your health care provider how often you should have your eyes checked.  Personal lifestyle choices, including: ? Daily care of your teeth and gums. ? Regular physical activity. ? Eating a healthy diet. ? Avoiding tobacco and drug use. ? Limiting alcohol use. ? Practicing safe sex. ? Taking vitamin and mineral supplements as recommended by your health care provider. What happens during an annual well check? The services and screenings done by your health care provider during your annual well check will depend on your age, overall health, lifestyle risk factors, and family history of disease. Counseling Your health care provider may ask you questions about your:  Alcohol use.  Tobacco use.  Drug use.  Emotional well-being.  Home and relationship well-being.  Sexual activity.  Eating habits.  Work and work Statistician.  Method of birth control.  Menstrual cycle.  Pregnancy history.  Screening You may have the following tests or measurements:  Height, weight, and BMI.  Diabetes screening. This is done by checking your blood sugar (glucose) after you have not eaten for a while (fasting).  Blood pressure.  Lipid and cholesterol levels. These may be checked every 5 years starting at age 66.  Skin check.  Hepatitis C blood test.  Hepatitis B blood test.  Sexually transmitted disease (STD) testing.  BRCA-related cancer screening. This may be done if you have a family history of breast, ovarian, tubal, or peritoneal cancers.  Pelvic exam and Pap test. This may be done every 3 years starting at age 40. Starting at age 59, this may be done every 5  years if you have a Pap test in combination with an HPV test.  Discuss your test results, treatment options, and if necessary, the need for more tests with your health care provider. Vaccines Your health care provider may recommend certain vaccines, such as:  Influenza vaccine. This is recommended every year.  Tetanus, diphtheria, and acellular pertussis (Tdap, Td) vaccine. You may need a Td booster every 10 years.  Varicella vaccine. You may need this if you have not been vaccinated.  HPV vaccine. If you are 69 or younger, you may need three doses over 6 months.  Measles, mumps, and rubella (MMR) vaccine. You may need at least one dose of MMR. You may also need a second dose.  Pneumococcal 13-valent conjugate (PCV13) vaccine. You may need this if you have certain conditions and were not previously vaccinated.  Pneumococcal polysaccharide (PPSV23) vaccine. You may need one or two doses if you smoke cigarettes or if you have certain conditions.  Meningococcal vaccine. One dose is recommended if you are age 27-21 years and a first-year college student living in a residence hall, or if you have one of several medical conditions. You may also need additional booster doses.  Hepatitis A vaccine. You may need this if you have certain conditions or if you travel or work in places where you may be exposed to hepatitis A.  Hepatitis B vaccine. You may need this if you have certain conditions or if you travel or work in places where you may be exposed to hepatitis B.  Haemophilus influenzae type b (Hib) vaccine. You may need this if  you have certain risk factors.  Talk to your health care provider about which screenings and vaccines you need and how often you need them. This information is not intended to replace advice given to you by your health care provider. Make sure you discuss any questions you have with your health care provider. Document Released: 12/20/2001 Document Revised: 07/13/2016  Document Reviewed: 08/25/2015 Elsevier Interactive Patient Education  2018 [place] Maintenance, Female Adopting a healthy lifestyle and getting preventive care can go a long way to promote health and wellness. Talk with your health care provider about what schedule of regular examinations is right for you. This is a good chance for you to check in with your provider about disease prevention and staying healthy. In between checkups, there are plenty of things you can do on your own. Experts have done a lot of research about which lifestyle changes and preventive measures are most likely to keep you healthy. Ask your health care provider for more information. Weight and diet Eat a healthy diet  Be sure to include plenty of vegetables, fruits, low-fat dairy products, and lean protein.  Do not eat a lot of foods high in solid fats, added sugars, or salt.  Get regular exercise. This is one of the most important things you can do for your health. ? Most adults should exercise for at least 150 minutes each week. The exercise should increase your heart rate and make you sweat (moderate-intensity exercise). ? Most adults should also do strengthening exercises at least twice a week. This is in addition to the moderate-intensity exercise.  Maintain a healthy weight  Body mass index (BMI) is a measurement that can be used to identify possible weight problems. It estimates body fat based on height and weight. Your health care provider can help determine your BMI and help you achieve or maintain a healthy weight.  For females 63 years of age and older: ? A BMI below 18.5 is considered underweight. ? A BMI of 18.5 to 24.9 is normal. ? A BMI of 25 to 29.9 is considered overweight. ? A BMI of 30 and above is considered obese.  Watch levels of cholesterol and blood lipids  You should start having your blood tested for lipids and cholesterol at 38 years of age, then have this test every 5  years.  You may need to have your cholesterol levels checked more often if: ? Your lipid or cholesterol levels are high. ? You are older than 38 years of age. ? You are at high risk for heart disease.  Cancer screening Lung Cancer  Lung cancer screening is recommended for adults 66-17 years old who are at high risk for lung cancer because of a history of smoking.  A yearly low-dose CT scan of the lungs is recommended for people who: ? Currently smoke. ? Have quit within the past 15 years. ? Have at least a 30-pack-year history of smoking. A pack year is smoking an average of one pack of cigarettes a day for 1 year.  Yearly screening should continue until it has been 15 years since you quit.  Yearly screening should stop if you develop a health problem that would prevent you from having lung cancer treatment.  Breast Cancer  Practice breast self-awareness. This means understanding how your breasts normally appear and feel.  It also means doing regular breast self-exams. Let your health care provider know about any changes, no matter how small.  If you are in your 17s  or 30s, you should have a clinical breast exam (CBE) by a health care provider every 1-3 years as part of a regular health exam.  If you are 40 or older, have a CBE every year. Also consider having a breast X-ray (mammogram) every year.  If you have a family history of breast cancer, talk to your health care provider about genetic screening.  If you are at high risk for breast cancer, talk to your health care provider about having an MRI and a mammogram every year.  Breast cancer gene (BRCA) assessment is recommended for women who have family members with BRCA-related cancers. BRCA-related cancers include: ? Breast. ? Ovarian. ? Tubal. ? Peritoneal cancers.  Results of the assessment will determine the need for genetic counseling and BRCA1 and BRCA2 testing.  Cervical Cancer Your health care provider may  recommend that you be screened regularly for cancer of the pelvic organs (ovaries, uterus, and vagina). This screening involves a pelvic examination, including checking for microscopic changes to the surface of your cervix (Pap test). You may be encouraged to have this screening done every 3 years, beginning at age 21.  For women ages 30-65, health care providers may recommend pelvic exams and Pap testing every 3 years, or they may recommend the Pap and pelvic exam, combined with testing for human papilloma virus (HPV), every 5 years. Some types of HPV increase your risk of cervical cancer. Testing for HPV may also be done on women of any age with unclear Pap test results.  Other health care providers may not recommend any screening for nonpregnant women who are considered low risk for pelvic cancer and who do not have symptoms. Ask your health care provider if a screening pelvic exam is right for you.  If you have had past treatment for cervical cancer or a condition that could lead to cancer, you need Pap tests and screening for cancer for at least 20 years after your treatment. If Pap tests have been discontinued, your risk factors (such as having a new sexual partner) need to be reassessed to determine if screening should resume. Some women have medical problems that increase the chance of getting cervical cancer. In these cases, your health care provider may recommend more frequent screening and Pap tests.  Colorectal Cancer  This type of cancer can be detected and often prevented.  Routine colorectal cancer screening usually begins at 38 years of age and continues through 38 years of age.  Your health care provider may recommend screening at an earlier age if you have risk factors for colon cancer.  Your health care provider may also recommend using home test kits to check for hidden blood in the stool.  A small camera at the end of a tube can be used to examine your colon directly  (sigmoidoscopy or colonoscopy). This is done to check for the earliest forms of colorectal cancer.  Routine screening usually begins at age 50.  Direct examination of the colon should be repeated every 5-10 years through 38 years of age. However, you may need to be screened more often if early forms of precancerous polyps or small growths are found.  Skin Cancer  Check your skin from head to toe regularly.  Tell your health care provider about any new moles or changes in moles, especially if there is a change in a mole's shape or color.  Also tell your health care provider if you have a mole that is larger than the size of a   pencil eraser.  Always use sunscreen. Apply sunscreen liberally and repeatedly throughout the day.  Protect yourself by wearing long sleeves, pants, a wide-brimmed hat, and sunglasses whenever you are outside.  Heart disease, diabetes, and high blood pressure  High blood pressure causes heart disease and increases the risk of stroke. High blood pressure is more likely to develop in: ? People who have blood pressure in the high end of the normal range (130-139/85-89 mm Hg). ? People who are overweight or obese. ? People who are African American.  If you are 18-92 years of age, have your blood pressure checked every 3-5 years. If you are 9 years of age or older, have your blood pressure checked every year. You should have your blood pressure measured twice-once when you are at a hospital or clinic, and once when you are not at a hospital or clinic. Record the average of the two measurements. To check your blood pressure when you are not at a hospital or clinic, you can use: ? An automated blood pressure machine at a pharmacy. ? A home blood pressure monitor.  If you are between 34 years and 63 years old, ask your health care provider if you should take aspirin to prevent strokes.  Have regular diabetes screenings. This involves taking a blood sample to check your  fasting blood sugar level. ? If you are at a normal weight and have a low risk for diabetes, have this test once every three years after 38 years of age. ? If you are overweight and have a high risk for diabetes, consider being tested at a younger age or more often. Preventing infection Hepatitis B  If you have a higher risk for hepatitis B, you should be screened for this virus. You are considered at high risk for hepatitis B if: ? You were born in a country where hepatitis B is common. Ask your health care provider which countries are considered high risk. ? Your parents were born in a high-risk country, and you have not been immunized against hepatitis B (hepatitis B vaccine). ? You have HIV or AIDS. ? You use needles to inject street drugs. ? You live with someone who has hepatitis B. ? You have had sex with someone who has hepatitis B. ? You get hemodialysis treatment. ? You take certain medicines for conditions, including cancer, organ transplantation, and autoimmune conditions.  Hepatitis C  Blood testing is recommended for: ? Everyone born from [birth year] through 1966. ? Anyone with known risk factors for hepatitis C.  Sexually transmitted infections (STIs)  You should be screened for sexually transmitted infections (STIs) including gonorrhea and chlamydia if: ? You are sexually active and are younger than 38 years of age. ? You are older than 38 years of age and your health care provider tells you that you are at risk for this type of infection. ? Your sexual activity has changed since you were last screened and you are at an increased risk for chlamydia or gonorrhea. Ask your health care provider if you are at risk.  If you do not have HIV, but are at risk, it may be recommended that you take a prescription medicine daily to prevent HIV infection. This is called pre-exposure prophylaxis (PrEP). You are considered at risk if: ? You are sexually active and do not regularly use condoms  or know the HIV status of your partner(s). ? You take drugs by injection. ? You are sexually active with a partner who has HIV.  Talk with your health care provider about whether you are at high risk of being infected with HIV. If you choose to begin PrEP, you should first be tested for HIV. You should then be tested every 3 months for as long as you are taking PrEP. Pregnancy  If you are premenopausal and you may become pregnant, ask your health care provider about preconception counseling.  If you may become pregnant, take 400 to 800 micrograms (mcg) of folic acid every day.  If you want to prevent pregnancy, talk to your health care provider about birth control (contraception). Osteoporosis and menopause  Osteoporosis is a disease in which the bones lose minerals and strength with aging. This can result in serious bone fractures. Your risk for osteoporosis can be identified using a bone density scan.  If you are 60 years of age or older, or if you are at risk for osteoporosis and fractures, ask your health care provider if you should be screened.  Ask your health care provider whether you should take a calcium or vitamin D supplement to lower your risk for osteoporosis.  Menopause may have certain physical symptoms and risks.  Hormone replacement therapy may reduce some of these symptoms and risks. Talk to your health care provider about whether hormone replacement therapy is right for you. Follow these instructions at home:  Schedule regular health, dental, and eye exams.  Stay current with your immunizations.  Do not use any tobacco products including cigarettes, chewing tobacco, or electronic cigarettes.  If you are pregnant, do not drink alcohol.  If you are breastfeeding, limit how much and how often you drink alcohol.  Limit alcohol intake to no more than 1 drink per day for nonpregnant women. One drink equals 12 ounces of beer, 5 ounces of wine, or 1 ounces of hard  liquor.  Do not use street drugs.  Do not share needles.  Ask your health care provider for help if you need support or information about quitting drugs.  Tell your health care provider if you often feel depressed.  Tell your health care provider if you have ever been abused or do not feel safe at home. This information is not intended to replace advice given to you by your health care provider. Make sure you discuss any questions you have with your health care provider. Document Released: 05/09/2011 Document Revised: 03/31/2016 Document Reviewed: 07/28/2015 Elsevier Interactive Patient Education  [employer].

## 2018-07-09 ENCOUNTER — Encounter (HOSPITAL_COMMUNITY): Payer: Self-pay | Admitting: Emergency Medicine

## 2018-07-09 ENCOUNTER — Ambulatory Visit (HOSPITAL_COMMUNITY)
Admission: EM | Admit: 2018-07-09 | Discharge: 2018-07-09 | Disposition: A | Discharge status: Home / Self Care | Payer: 59 | Attending: Family Medicine | Admitting: Family Medicine

## 2018-07-09 DIAGNOSIS — L03211 Cellulitis of face: Secondary | ICD-10-CM | POA: Diagnosis not present

## 2018-07-09 MED ORDER — CLINDAMYCIN HCL 300 MG PO CAPS: 300.0000 mg | ORAL_CAPSULE | Freq: Three times a day (TID) | ORAL | 0 refills | Status: AC

## 2018-07-09 NOTE — ED Provider Notes (Signed)
07/09/2018 6:28 PM   DOB: [birth year] / MRN: [id number]  SUBJECTIVE:  Carol Miller is a 37 y.o. female presenting for nasal rash and ipsilateral tonsillar lymphadenopathy that started 3 days ago. She had "microderm abrasion to the area 3 days earlier.  Denies radicular pain.  Has tried nothing.   She is allergic to penicillins and latex.   She  has a past medical history of Asthma, Frequency of urination, Hematuria, History of kidney stones, History of seizures as a child, Nephrolithiasis, Right ureteral stone, and Urgency of urination.    She  reports that she quit smoking about 7 years ago. Her smoking use included cigarettes. She quit after 15.00 years of use. She has never used smokeless tobacco. She reports that she does not drink alcohol or use drugs. She  reports that she currently engages in sexual activity. She reports using the following method of birth control/protection: Surgical. The patient  has a past surgical history that includes Tonsillectomy (as child); Cervical cerclage (10/29/2009); Laparoscopic tubal ligation (Bilateral, 05/14/2010); Extracorporeal shock wave lithotripsy (Right, 12/01/2011); Colonoscopy (2016); Tubal ligation; and Cystoscopy/ureteroscopy/holmium laser/stent placement (Right, 06/10/2016).  Her family history is not on file.  Review of Systems  Constitutional: Negative for chills, diaphoresis and fever.  Eyes: Negative.   Respiratory: Negative for cough, hemoptysis, sputum production, shortness of breath and wheezing.   Cardiovascular: Negative for chest pain, orthopnea and leg swelling.  Gastrointestinal: Negative for abdominal pain, blood in stool, constipation, diarrhea, heartburn, melena, nausea and vomiting.  Genitourinary: Negative for dysuria, flank pain, frequency, hematuria and urgency.  Skin: Positive for rash. Negative for itching.  Neurological: Negative for dizziness, sensory change, speech change, focal weakness and headaches.     OBJECTIVE:  BP 105/75   Pulse 71   Temp 98.3 F (36.8 C)   Resp 16   LMP 06/25/2018   SpO2 100%   Wt Readings from Last 3 Encounters:  06/23/18 243 lb 9.6 oz (110.5 kg)  06/10/16 218 lb 8 oz (99.1 kg)  12/14/15 215 lb (97.5 kg)   Temp Readings from Last 3 Encounters:  07/09/18 98.3 F (36.8 C)  06/23/18 98.3 F (36.8 C) (Oral)  05/07/17 98.7 F (37.1 C) (Oral)   BP Readings from Last 3 Encounters:  07/09/18 105/75  06/23/18 122/70  05/07/17 121/82   Pulse Readings from Last 3 Encounters:  07/09/18 71  06/23/18 82  05/07/17 66    Physical Exam  Constitutional: She is oriented to person, place, and time. She appears well-nourished. No distress.  HENT:  Head:    Eyes: Pupils are equal, round, and reactive to light. EOM are normal.  Cardiovascular: Normal rate, regular rhythm, S1 normal, S2 normal, normal heart sounds and intact distal pulses. Exam reveals no gallop, no friction rub and no decreased pulses.  No murmur heard. Pulmonary/Chest: Effort normal. No stridor. No respiratory distress. She has no wheezes. She has no rales.  Abdominal: She exhibits no distension.  Musculoskeletal: She exhibits no edema.  Neurological: She is alert and oriented to person, place, and time. No cranial nerve deficit. Gait normal.  Skin: Skin is dry. She is not diaphoretic.  Psychiatric: She has a normal mood and affect.  Vitals reviewed.   No results found for this or any previous visit (from the past 72 hour(s)).  No results found.  ASSESSMENT AND PLAN:   Facial cellulitis    Discharge Instructions     Start the antibiotic asap.  Come back or go to the ED  if your not getting better in the the next 48-72 hours.  Go to the ED if you are getting worse in 24 hours.         The patient is advised to call or return to clinic if she does not see an improvement in symptoms, or to seek the care of the closest emergency department if she worsens with the above plan.    Deliah Boston, MHS, PA-C 07/09/2018 6:28 PM   Ofilia Neas, PA-C 07/09/18 [place]

## 2018-07-09 NOTE — ED Triage Notes (Signed)
Pt c/o cold symptoms, "not feeling well" face breaking out, and swollen lymph nodes in neck.

## 2018-07-09 NOTE — Discharge Instructions (Addendum)
Start the antibiotic asap.  Come back or go to the ED if your not getting better in the the next 48-72 hours.  Go to the ED if you are getting worse in 24 hours.

## 2018-07-10 ENCOUNTER — Encounter (HOSPITAL_COMMUNITY): Payer: Self-pay | Admitting: Physician Assistant

## 2018-07-10 ENCOUNTER — Emergency Department (HOSPITAL_COMMUNITY)
Admission: EM | Admit: 2018-07-10 | Discharge: 2018-07-10 | Disposition: A | Discharge status: Home / Self Care | Payer: 59 | Attending: Emergency Medicine | Admitting: Emergency Medicine

## 2018-07-10 ENCOUNTER — Encounter (HOSPITAL_COMMUNITY): Payer: Self-pay | Admitting: *Deleted

## 2018-07-10 DIAGNOSIS — R238 Other skin changes: Secondary | ICD-10-CM

## 2018-07-10 DIAGNOSIS — Z87891 Personal history of nicotine dependence: Secondary | ICD-10-CM | POA: Insufficient documentation

## 2018-07-10 DIAGNOSIS — J45909 Unspecified asthma, uncomplicated: Secondary | ICD-10-CM | POA: Diagnosis not present

## 2018-07-10 DIAGNOSIS — Z91040 Latex allergy status: Secondary | ICD-10-CM | POA: Diagnosis not present

## 2018-07-10 DIAGNOSIS — L0201 Cutaneous abscess of face: Secondary | ICD-10-CM | POA: Insufficient documentation

## 2018-07-10 DIAGNOSIS — R21 Rash and other nonspecific skin eruption: Secondary | ICD-10-CM | POA: Diagnosis present

## 2018-07-10 DIAGNOSIS — L03211 Cellulitis of face: Secondary | ICD-10-CM

## 2018-07-10 LAB — I-STAT BETA HCG BLOOD, ED (MC, WL, AP ONLY): I-stat hCG, quantitative: <5 m[IU]/mL (ref <5)

## 2018-07-10 LAB — CBC WITH DIFFERENTIAL/PLATELET
ABS IMMATURE GRANULOCYTES: 0 10*3/uL (ref 0.0–0.1)
BASOS PCT: 1 %
Basophils Absolute: 0 10*3/uL (ref 0.0–0.1)
Eosinophils Absolute: 0 10*3/uL (ref 0.0–0.7)
Eosinophils Relative: 1 %
HCT: 41.3 % (ref 36.0–46.0)
Hemoglobin: 12.9 g/dL (ref 12.0–15.0)
Immature Granulocytes: 0 %
Lymphocytes Relative: 38 %
Lymphs Abs: 2.1 10*3/uL (ref 0.7–4.0)
MCH: 28.3 pg (ref 26.0–34.0)
MCHC: 31.2 g/dL (ref 30.0–36.0)
MCV: 90.6 fL (ref 78.0–100.0)
MONO ABS: 0.5 10*3/uL (ref 0.1–1.0)
MONOS PCT: 10 %
NEUTROS ABS: 2.8 10*3/uL (ref 1.7–7.7)
Neutrophils Relative %: 50 %
PLATELETS: 226 10*3/uL (ref 150–400)
RBC: 4.56 MIL/uL (ref 3.87–5.11)
RDW: 14.5 % (ref 11.5–15.5)
WBC: 5.6 10*3/uL (ref 4.0–10.5)

## 2018-07-10 LAB — BASIC METABOLIC PANEL
ANION GAP: 6 (ref 5–15)
BUN: 9 mg/dL (ref 6–20)
CALCIUM: 8.7 mg/dL — AB (ref 8.9–10.3)
CO2: 24 mmol/L (ref 22–32)
Chloride: 108 mmol/L (ref 98–111)
Creatinine, Ser: 0.68 mg/dL (ref 0.44–1.00)
GFR calc Af Amer: >60 mL/min (ref >60)
GFR calc non Af Amer: >60 mL/min (ref >60)
GLUCOSE: 94 mg/dL (ref 70–99)
Potassium: 3.8 mmol/L (ref 3.5–5.1)
Sodium: 138 mmol/L (ref 135–145)

## 2018-07-10 LAB — I-STAT CG4 LACTIC ACID, ED: Lactic Acid, Venous: 0.48 mmol/L — ABNORMAL LOW (ref 0.5–1.9)

## 2018-07-10 MED ORDER — NAPROXEN 500 MG PO TABS: 500.0000 mg | ORAL_TABLET | Freq: Two times a day (BID) | ORAL | 0 refills | Status: AC

## 2018-07-10 MED ORDER — KETOROLAC TROMETHAMINE 30 MG/ML IJ SOLN
30.0000 mg | Freq: Once | INTRAMUSCULAR | Status: AC
  Administered 2018-07-10: 30 mg via INTRAVENOUS
  Filled 2018-07-10: qty 1

## 2018-07-10 MED ORDER — VALACYCLOVIR HCL 1 G PO TABS: 1000.0000 mg | ORAL_TABLET | Freq: Three times a day (TID) | ORAL | 0 refills | Status: AC

## 2018-07-10 MED ORDER — SODIUM CHLORIDE 0.9 % IV SOLN
Freq: Once | INTRAVENOUS | Status: AC
  Administered 2018-07-10: 13:00:00 via INTRAVENOUS

## 2018-07-10 MED ORDER — DEXTROSE 5 % IV SOLN
500.0000 mg | Freq: Once | INTRAVENOUS | Status: AC
  Administered 2018-07-10: 500 mg via INTRAVENOUS
  Filled 2018-07-10: qty 10

## 2018-07-10 MED ORDER — CLINDAMYCIN PHOSPHATE 600 MG/50ML IV SOLN
600.0000 mg | Freq: Once | INTRAVENOUS | Status: AC
  Administered 2018-07-10: 600 mg via INTRAVENOUS
  Filled 2018-07-10: qty 50

## 2018-07-10 MED ORDER — HYDROCODONE-ACETAMINOPHEN 5-325 MG PO TABS: 1.0000 | ORAL_TABLET | Freq: Four times a day (QID) | ORAL | 0 refills | Status: AC | PRN

## 2018-07-10 NOTE — ED Provider Notes (Signed)
[facility] EMERGENCY DEPARTMENT Provider Note   CSN: [id number] Arrival date & time: 07/10/18  4098     History   Chief Complaint Chief Complaint  Patient presents with  . Abscess    HPI Carol Miller is a 37 y.o. female.  HPI Has developed a painful rash to the right side of her nose and now spreading onto her cheek.  This started 4 days ago.  The nasal alae has become more swollen and inflamed.  She did have some swollen lymph nodes under her chin but now she has more discomfort and swelling feeling across her cheek.  She was started on clindamycin after being seen in urgent care yesterday.  He took 1 dose yesterday evening and 2 doses today.  She reports there is been some both straw-colored and purulent looking drainage.  No fever but she has noted some general chills.  Generalized headache.  No itching or burning of the eye.  No visual changes.  No pain in the ear.  No rash elsewhere.  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  No direct exposure that she is aware of to herpetic infections.  Reportedly, she had a micro-dermabrasion several days before onset of symptoms.  She is healthy without other medical problems.  She has 2 children at home ages 6 and 57.  Neither have rash or illness at this time. Past Medical History:  Diagnosis Date  . Asthma   . Frequency of urination   . Hematuria   . History of kidney stones   . History of seizures as a child    NONE SINCE PER PT  . Nephrolithiasis   . Right ureteral stone   . Urgency of urination     Patient Active Problem List   Diagnosis Date Noted  . Ureteral calculus, right 12/01/2011    Past Surgical History:  Procedure Laterality Date  . CERVICAL CERCLAGE  10/29/2009  . COLONOSCOPY  2016  . CYSTOSCOPY/URETEROSCOPY/HOLMIUM LASER/STENT PLACEMENT Right 06/10/2016   Procedure: CYSTOSCOPY/URETEROSCOPY/HOLMIUM LASER/STENT PLACEMENT;  Surgeon: Crist Fat, MD;  Location: [facility];  Service: Urology;  Laterality: Right;  . EXTRACORPOREAL SHOCK WAVE LITHOTRIPSY Right 12/01/2011  . LAPAROSCOPIC TUBAL LIGATION Bilateral 05/14/2010   w/ fulgeration  . TONSILLECTOMY  as child  . TUBAL LIGATION       OB History   None      Home Medications    Prior to Admission medications   Medication Sig Start Date End Date Taking? Authorizing Provider  clindamycin (CLEOCIN) 300 MG capsule Take 1 capsule (300 mg total) by mouth 3 (three) times daily. 07/09/18   Ofilia Neas, PA-C  HYDROcodone-acetaminophen (NORCO/VICODIN) 5-325 MG tablet Take 1-2 tablets by mouth every 6 (six) hours as needed for moderate pain or severe pain. 07/10/18   Arby Barrette, MD  naproxen (NAPROSYN) 500 MG tablet Take 1 tablet (500 mg total) by mouth 2 (two) times daily. 07/10/18   Arby Barrette, MD  valACYclovir (VALTREX) 1000 MG tablet Take 1 tablet (1,000 mg total) by mouth 3 (three) times daily for 14 days. 07/10/18 07/24/18  Arby Barrette, MD    Family History History reviewed. No pertinent family history.  Social History Social History   Tobacco Use  . Smoking status: Former Smoker    Years: 15.00    Types: Cigarettes    Last attempt to quit: 06/08/2011    Years since quitting: 7.0  . Smokeless tobacco: Never  Used  Substance Use Topics  . Alcohol use: No  . Drug use: No     Allergies   Penicillins and Latex   Review of Systems Review of Systems 10 Systems reviewed and are negative for acute change except as noted in the HPI.   Physical Exam Updated Vital Signs BP 115/84   Pulse 71   Temp 98.6 F (37 C) (Oral)   Resp 17   LMP 06/25/2018   SpO2 100%   Physical Exam  Constitutional: She is oriented to person, place, and time. She appears well-developed and well-nourished.  HENT:  Head: Normocephalic and atraumatic.  Lateral TMs normal except tympanosclerosis.  Right ear canal is normal without lesions.  No pain with movement of the pinna or the  tragus.  Both nares are patent.  See images for erythematous and very tender alae on the right.  There are also about 4-5 vesicular lesions developing on the area of the nasolabial fold and one on the lip.  Normal intraoral exam with no lesions and widely patent posterior oropharynx.  Eyes: Pupils are equal, round, and reactive to light. EOM are normal.  No swelling about the eyes.  With magnified examination no apparent lesions or disruptions of the cornea and no injection.  Neck: Neck supple.  Patient has tender submandibular lymphadenopathy.  No meningismus.  Cardiovascular: Normal rate, regular rhythm, normal heart sounds and intact distal pulses.  Pulmonary/Chest: Effort normal and breath sounds normal.  Abdominal: Soft. Bowel sounds are normal. She exhibits no distension. There is no tenderness.  Musculoskeletal: Normal range of motion. She exhibits no edema or tenderness.  No joint effusions.  Neurological: She is alert and oriented to person, place, and time. She has normal strength. Coordination normal. GCS eye subscore is 4. GCS verbal subscore is 5. GCS motor subscore is 6.  Skin: Skin is warm, dry and intact.  Except for facial rash no other rashes.  Psychiatric: She has a normal mood and affect.         ED Treatments / Results  Labs (all labs ordered are listed, but only abnormal results are displayed) Labs Reviewed  BASIC METABOLIC PANEL - Abnormal; Notable for the following components:      Result Value   Calcium 8.7 (*)    All other components within normal limits  I-STAT CG4 LACTIC ACID, ED - Abnormal; Notable for the following components:   Lactic Acid, Venous 0.48 (*)    All other components within normal limits  CBC WITH DIFFERENTIAL/PLATELET  HSV(HERPES SIMPLEX VRS) I + II AB-IGG  HSV(HERPES SIMPLEX VRS) I + II AB-IGM  VARICELLA ZOSTER ANTIBODY, IGG  VARICELLA ZOSTER ANTIBODY, IGM  I-STAT BETA HCG BLOOD, ED ([facility], [facility], [facility] ONLY)  I-STAT CG4 LACTIC ACID, ED     EKG None  Radiology No results found.  Procedures Procedures (including critical care time)  Medications Ordered in ED Medications  acyclovir (ZOVIRAX) 500 mg in dextrose 5 % 100 mL IVPB (500 mg Intravenous New Bag/Given 07/10/18 1314)  clindamycin (CLEOCIN) IVPB 600 mg (600 mg Intravenous New Bag/Given 07/10/18 1307)  0.9 %  sodium chloride infusion ( Intravenous New Bag/Given 07/10/18 1315)  ketorolac (TORADOL) 30 MG/ML injection 30 mg (30 mg Intravenous Given 07/10/18 1307)     Initial Impression / Assessment and Plan / ED Course  I have reviewed the triage vital signs and the nursing notes.  Pertinent labs & imaging results that were available during my care of the patient were reviewed by  me and considered in my medical decision making (see chart for details).      Final Clinical Impressions(s) / ED Diagnoses   Final diagnoses:  Cellulitis of face  Vesicular rash  Patient started on clindamycin yesterday but getting increasing facial pain and redness around the nasal alae.  There are several vesicular lesions which suggest this is a primary viral infection.  Does have history of chickenpox.  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  She has no prior history of cold sores or oral lesions that would suggest history of HSV 1.  Patient does not have fever or headache.  She has no ocular symptoms.  I will have her continue the clindamycin already started yesterday and add Valtrex.  Return precautions are reviewed.  ED Discharge Orders         Ordered    valACYclovir (VALTREX) 1000 MG tablet  3 times daily     07/10/18 1408    HYDROcodone-acetaminophen (NORCO/VICODIN) 5-325 MG tablet  Every 6 hours PRN     07/10/18 1408    naproxen (NAPROSYN) 500 MG tablet  2 times daily     07/10/18 1408           Arby Barrette, MD 07/10/18 1414

## 2018-07-10 NOTE — ED Triage Notes (Signed)
Pt in c/o facial abscess, seen at urgent care yesterday for this and told to come to ED if symptoms worsened, increased pain and swelling today, started on clindamycin yesterday

## 2018-07-10 NOTE — Discharge Instructions (Signed)
1.  You have had labs drawn to test for HSV type I primary infection.  Other rashes that can look similar are shingles.  You also have significant redness and swelling that may represent an area of cellulitis. 2.  Continue your clindamycin as prescribed.  Start Valtrex today as prescribed.  Take naproxen for pain control.  Use Vicodin if needed for more severe pain. 3.  Return immediately if you develop fever, headache, burning, itching or pain in your eye or other concerning symptoms.

## 2018-07-11 LAB — HSV(HERPES SIMPLEX VRS) I + II AB-IGG
HSV 1 Glycoprotein G Ab, IgG: <0.91 index (ref 0.00–0.90)
HSV 2 Glycoprotein G Ab, IgG: 4.53 index — ABNORMAL HIGH (ref 0.00–0.90)

## 2018-07-11 LAB — HSV-2 IGG SUPPLEMENTAL TEST: HSV-2 IgG Supplemental Test: POSITIVE — AB

## 2018-07-11 LAB — VARICELLA ZOSTER ANTIBODY, IGM

## 2018-07-11 LAB — VARICELLA ZOSTER ANTIBODY, IGG: Varicella IgG: 1322 index (ref >165)

## 2018-07-12 LAB — HSV(HERPES SIMPLEX VRS) I + II AB-IGM: HSVI/II COMB AB IGM: 1.02 ratio — AB (ref 0.00–0.90)

## 2019-05-05 ENCOUNTER — Other Ambulatory Visit: Payer: Self-pay

## 2019-05-05 ENCOUNTER — Encounter (HOSPITAL_COMMUNITY): Payer: Self-pay

## 2019-05-05 ENCOUNTER — Emergency Department (HOSPITAL_COMMUNITY): Payer: 59

## 2019-05-05 ENCOUNTER — Emergency Department (HOSPITAL_COMMUNITY)
Admission: EM | Admit: 2019-05-05 | Discharge: 2019-05-05 | Disposition: A | Discharge status: Home / Self Care | Payer: 59 | Attending: Emergency Medicine | Admitting: Emergency Medicine

## 2019-05-05 DIAGNOSIS — Z91040 Latex allergy status: Secondary | ICD-10-CM | POA: Diagnosis not present

## 2019-05-05 DIAGNOSIS — Y92410 Unspecified street and highway as the place of occurrence of the external cause: Secondary | ICD-10-CM | POA: Insufficient documentation

## 2019-05-05 DIAGNOSIS — Z87891 Personal history of nicotine dependence: Secondary | ICD-10-CM | POA: Diagnosis not present

## 2019-05-05 DIAGNOSIS — M79662 Pain in left lower leg: Secondary | ICD-10-CM | POA: Diagnosis not present

## 2019-05-05 DIAGNOSIS — Y999 Unspecified external cause status: Secondary | ICD-10-CM | POA: Insufficient documentation

## 2019-05-05 DIAGNOSIS — Y9389 Activity, other specified: Secondary | ICD-10-CM | POA: Insufficient documentation

## 2019-05-05 DIAGNOSIS — J45909 Unspecified asthma, uncomplicated: Secondary | ICD-10-CM | POA: Insufficient documentation

## 2019-05-05 DIAGNOSIS — M25512 Pain in left shoulder: Secondary | ICD-10-CM | POA: Insufficient documentation

## 2019-05-05 DIAGNOSIS — M7918 Myalgia, other site: Secondary | ICD-10-CM

## 2019-05-05 DIAGNOSIS — Z79899 Other long term (current) drug therapy: Secondary | ICD-10-CM | POA: Insufficient documentation

## 2019-05-05 MED ORDER — HYDROMORPHONE HCL 1 MG/ML IJ SOLN
0.5000 mg | Freq: Once | INTRAMUSCULAR | Status: AC
  Administered 2019-05-05: 22:00:00 0.5 mg via INTRAVENOUS
  Filled 2019-05-05: qty 1

## 2019-05-05 MED ORDER — KETOROLAC TROMETHAMINE 15 MG/ML IJ SOLN
15.0000 mg | Freq: Once | INTRAMUSCULAR | Status: AC
  Administered 2019-05-05: 15 mg via INTRAVENOUS
  Filled 2019-05-05: qty 1
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
## 2019-05-05 NOTE — ED Notes (Signed)
Patient transported to X-ray 
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
## 2020-04-23 ENCOUNTER — Ambulatory Visit: Payer: 59 | Admitting: Physical Therapy

## 2020-04-29 ENCOUNTER — Ambulatory Visit: Payer: 59 | Attending: Sports Medicine | Admitting: Physical Therapy

## 2020-04-29 ENCOUNTER — Other Ambulatory Visit: Payer: Self-pay

## 2020-04-29 ENCOUNTER — Encounter: Payer: Self-pay | Admitting: Physical Therapy

## 2020-04-29 DIAGNOSIS — M546 Pain in thoracic spine: Secondary | ICD-10-CM | POA: Diagnosis present

## 2020-04-29 DIAGNOSIS — N62 Hypertrophy of breast: Secondary | ICD-10-CM | POA: Diagnosis present

## 2020-04-29 DIAGNOSIS — R293 Abnormal posture: Secondary | ICD-10-CM | POA: Diagnosis present

## 2020-04-29 DIAGNOSIS — M542 Cervicalgia: Secondary | ICD-10-CM | POA: Insufficient documentation
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
## 2020-05-06 ENCOUNTER — Ambulatory Visit: Payer: 59 | Admitting: Physical Therapy

## 2020-05-06 ENCOUNTER — Other Ambulatory Visit: Payer: Self-pay

## 2020-05-06 ENCOUNTER — Encounter: Payer: Self-pay | Admitting: Physical Therapy

## 2020-05-06 DIAGNOSIS — M542 Cervicalgia: Secondary | ICD-10-CM

## 2020-05-06 DIAGNOSIS — R293 Abnormal posture: Secondary | ICD-10-CM

## 2020-05-06 DIAGNOSIS — N62 Hypertrophy of breast: Secondary | ICD-10-CM

## 2020-05-06 DIAGNOSIS — M546 Pain in thoracic spine: Secondary | ICD-10-CM

## 2020-05-06 NOTE — Therapy (Signed)
[facility] [address]  [address] [place], [place], [address] Phone: [phone]   Fax:  [phone]  Physical Therapy Treatment  Patient Details  Name: Carol Miller MRN: [id number] Date of Birth: 1981-03-14 Referring Provider (PT): Dr Pati Gallo   Encounter Date: 05/06/2020   PT End of Session - 05/06/20 0805    Visit Number 2    Number of Visits 6    Date for PT Re-Evaluation 06/10/20    Authorization Type UHC    PT Start Time 0805   Pt arrived late   PT Stop Time 0844    PT Time Calculation (min) 39 min    Activity Tolerance Patient tolerated treatment well    Behavior During Therapy [facility] for tasks assessed/performed           Past Medical History:  Diagnosis Date  . Asthma   . Frequency of urination   . Hematuria   . History of kidney stones   . History of seizures as a child    NONE SINCE PER PT  . Nephrolithiasis   . Right ureteral stone   . Urgency of urination     Past Surgical History:  Procedure Laterality Date  . CERVICAL CERCLAGE  10/29/2009  . COLONOSCOPY  2016  . CYSTOSCOPY/URETEROSCOPY/HOLMIUM LASER/STENT PLACEMENT Right 06/10/2016   Procedure: CYSTOSCOPY/URETEROSCOPY/HOLMIUM LASER/STENT PLACEMENT;  Surgeon: Crist Fat, MD;  Location: [facility];  Service: Urology;  Laterality: Right;  . EXTRACORPOREAL SHOCK WAVE LITHOTRIPSY Right 12/01/2011  . LAPAROSCOPIC TUBAL LIGATION Bilateral 05/14/2010   w/ fulgeration  . TONSILLECTOMY  as child  . TUBAL LIGATION      There were no vitals filed for this visit.   Subjective Assessment - 05/06/20 0807    Subjective Pt noting some benefit from DN last session but still notes constant pain in mid thoracic and periscapular area.    Currently in Pain? Yes    Pain Score 2    2-3/10   Pain Location Thoracic    Pain Orientation Mid    Pain Descriptors / Indicators Aching   "pinchy"   Pain Type Chronic pain    Pain Frequency  Constant                             [facility] Adult PT Treatment/Exercise - 05/06/20 0805      Shoulder Exercises: Standing   Row Both;10 reps;Strengthening;Theraband    Theraband Level (Shoulder Row) Level 2 (Red)    Retraction Both;10 reps;Strengthening;Theraband    Theraband Level (Shoulder Retraction) Level 2 (Red)    Retraction Limitations + mini-shoulder extension      Shoulder Exercises: ROM/Strengthening   UBE (Upper Arm Bike) L1.5 x 6 min (3' fwd/3' back)    "W" Arms Red TB "W" row 10 x 5"      Manual Therapy   Manual Therapy Soft tissue mobilization;Myofascial release    Manual therapy comments skilled palpation and monitoring during DN    Soft tissue mobilization STM to B UT, LS, cervical paraspinals and pecs    Myofascial Release manual TPR to B UT & pecs      Neck Exercises: Stretches   Upper Trapezius Stretch 20 seconds;1 rep    Levator Stretch 20 seconds;1 rep    Other Neck Stretches B rhomboid stretch 2 x 30 sec - 2nd rep at doorway  Trigger Point Dry Needling - 05/06/20 0805    Consent Given? Yes    Muscles Treated Head and Neck Upper trapezius    Muscles Treated Upper Quadrant Pectoralis major    Upper Trapezius Response Twitch reponse elicited;Palpable increased muscle length   bilat   Pectoralis Major Response Twitch response elicited;Palpable increased muscle length   bilat               PT Education - 05/06/20 0844    Education Details HEP review/update    Person(s) Educated Patient    Methods Explanation;Demonstration;Handout    Comprehension Verbalized understanding;Returned demonstration               PT Long Term Goals - 05/06/20 0810      PT LONG TERM GOAL #1   Title I with HEP for upper back pain relief    Time 6    Period Weeks    Status On-going    Target Date 06/10/20      PT LONG TERM GOAL #2   Title demo painfree and full thoracic rotation    Time 6    Period Weeks    Status On-going     Target Date 06/10/20      PT LONG TERM GOAL #3   Title report =/> 50% reduction of mid back pain after a work shift    Time 6    Period Weeks    Status On-going    Target Date 06/10/20      PT LONG TERM GOAL #4   Title improve Lt shoulder flexion strenght =/> 4+/5 to assist her at work.    Time 6    Period Weeks    Status On-going    Target Date 06/10/20                 Plan - 05/06/20 0811    Clinical Impression Statement Carol Miller reports benefit from DN with decreased intensity of pain and tension in upper back and periscapular area but still noting pain remains constant. She continues to perform preexisting HEP including UT and LS stretches as well as yellow TB resisted "W's". Clarified technique with stretches adding rhomboid stretch and modified/updated scapular strengthening progressing resistance to red TB and using external anchor at door frame to increase resistance into retraction. Given ongoing pain and increased muscle tension, continued manual therapy to upper shoulders, cervicothoracic musculature and pecs with palpable reduction in muscle tension noted. Will plan to address thoracic and cervical mobilization with foam roller next visit.    Personal Factors and Comorbidities Comorbidity 3+    Comorbidities asthma, mammory hypertrophy, obesity,    Examination-Activity Limitations Other;Caring for Others    Examination-Participation Restrictions Other    Rehab Potential Good    PT Frequency 1x / week    PT Duration 6 weeks    PT Treatment/Interventions Iontophoresis 4mg /ml Dexamethasone;Taping;Patient/family education;Functional mobility training;Moist Heat;Traction;Ultrasound;Passive range of motion;Therapeutic exercise;Dry needling;Spinal Manipulations;Manual techniques;Electrical Stimulation;Cryotherapy    PT Next Visit Plan thoracic and cervical mobilization, upper back/scapular strengthening,  manual therapy with DN to upper back and cervical musculature as  indincated, modalities PRN    PT Home Exercise Plan 6/30 - UT & LS stretrches (already doing), rhomboid stretch, red TB scap retraction + low, mid & W rows    Consulted and Agree with Plan of Care Patient           Patient will benefit from skilled therapeutic intervention in order to improve the following deficits and impairments:  Decreased range of motion, Obesity, Increased muscle spasms, Pain, Hypomobility, Decreased strength  Visit Diagnosis: Pain in thoracic spine  Cervicalgia  Abnormal posture  Symptomatic mammary hypertrophy     Problem List Patient Active Problem List   Diagnosis Date Noted  . Ureteral calculus, right 12/01/2011    Marry Guan, PT, MPT 05/06/2020, 2:01 PM  [facility] [address]  [address] [place], [place], [address] Phone: [phone]   Fax:  [phone]  Name: Carol Miller MRN: [id number] Date of Birth: 1981/02/08

## 2020-05-06 NOTE — Patient Instructions (Signed)
    Home exercise program created by Dorien Mayotte, PT.  For questions, please contact Alexyss Balzarini via phone at [phone] or email at [email or web address]  [facility] [place] [address]  [address] [place], [place], [address] Phone: [phone]   Fax:  [phone]    

## 2020-05-13 ENCOUNTER — Other Ambulatory Visit: Payer: Self-pay

## 2020-05-13 ENCOUNTER — Encounter: Payer: Self-pay | Admitting: Physical Therapy

## 2020-05-13 ENCOUNTER — Ambulatory Visit: Payer: 59 | Attending: Sports Medicine | Admitting: Physical Therapy

## 2020-05-13 DIAGNOSIS — R293 Abnormal posture: Secondary | ICD-10-CM | POA: Diagnosis present

## 2020-05-13 DIAGNOSIS — N62 Hypertrophy of breast: Secondary | ICD-10-CM | POA: Diagnosis present

## 2020-05-13 DIAGNOSIS — M546 Pain in thoracic spine: Secondary | ICD-10-CM | POA: Diagnosis present

## 2020-05-13 DIAGNOSIS — M542 Cervicalgia: Secondary | ICD-10-CM | POA: Insufficient documentation

## 2020-05-13 NOTE — Therapy (Signed)
[facility] Outpatient Rehabilitation [facility] [address]  [address] [place], [place], [address] Phone: [phone]   Fax:  [phone]  Physical Therapy Treatment  Patient Details  Name: Carol Miller MRN: [id number] Date of Birth: 09-04-1981 Referring Provider (PT): Dr Pati Gallo   Encounter Date: 05/13/2020   PT End of Session - 05/13/20 0845    Visit Number 3    Number of Visits 6    Date for PT Re-Evaluation 06/10/20    Authorization Type UHC    PT Start Time 0845    PT Stop Time 0954    PT Time Calculation (min) 69 min    Activity Tolerance Patient tolerated treatment well    Behavior During Therapy [facility] for tasks assessed/performed           Past Medical History:  Diagnosis Date  . Asthma   . Frequency of urination   . Hematuria   . History of kidney stones   . History of seizures as a child    NONE SINCE PER PT  . Nephrolithiasis   . Right ureteral stone   . Urgency of urination     Past Surgical History:  Procedure Laterality Date  . CERVICAL CERCLAGE  10/29/2009  . COLONOSCOPY  2016  . CYSTOSCOPY/URETEROSCOPY/HOLMIUM LASER/STENT PLACEMENT Right 06/10/2016   Procedure: CYSTOSCOPY/URETEROSCOPY/HOLMIUM LASER/STENT PLACEMENT;  Surgeon: Crist Fat, MD;  Location: [facility];  Service: Urology;  Laterality: Right;  . EXTRACORPOREAL SHOCK WAVE LITHOTRIPSY Right 12/01/2011  . LAPAROSCOPIC TUBAL LIGATION Bilateral 05/14/2010   w/ fulgeration  . TONSILLECTOMY  as child  . TUBAL LIGATION      There were no vitals filed for this visit.   Subjective Assessment - 05/13/20 0847    Subjective Pt continues to note benefit from DN - feels like it helps with her posture, allowing her to hold her shoulders back better.    Currently in Pain? Yes    Pain Score 3     Pain Location Thoracic   "under shoulder blades"   Pain Orientation Mid    Pain Descriptors / Indicators Aching   "digging"   Pain Type Chronic pain     Pain Frequency Constant                             [facility] Adult PT Treatment/Exercise - 05/13/20 0845      Shoulder Exercises: Supine   Horizontal ABduction Both;15 reps;Strengthening;Theraband    Theraband Level (Shoulder Horizontal ABduction) Level 2 (Red)    Horizontal ABduction Limitations hooklying on 6" FR to promote increased scap retraction    External Rotation Both;15 reps;Strengthening;Theraband    Theraband Level (Shoulder External Rotation) Level 2 (Red)    External Rotation Limitations hooklying on 6" FR to promote increased scap retraction    Diagonals Both;15 reps;Strengthening;Theraband    Theraband Level (Shoulder Diagonals) Level 2 (Red)    Diagonals Limitations hooklying on 6" FR to promote increased scap retraction      Shoulder Exercises: ROM/Strengthening   UBE (Upper Arm Bike) L2.0 x 6' (3' fwd/3' back)      Modalities   Modalities Electrical Stimulation;Moist Heat      Moist Heat Therapy   Number Minutes Moist Heat 15 Minutes    Moist Heat Location Cervical   thoracic     Electrical Stimulation   Electrical Stimulation Location thoracic paraspinals    Electrical Stimulation Action IFC  Electrical Stimulation Parameters 80-150 Hz, intensity to pt tol x 15'    Electrical Stimulation Goals Tone;Pain      Manual Therapy   Manual Therapy Soft tissue mobilization;Scapular mobilization;Other (comment)    Manual therapy comments skilled palpation and monitoring during DN    Soft tissue mobilization STM to B rhomboids & subscapularis    Scapular Mobilization B scapular release    Other Manual Therapy Provided instruction in foam rolling for thoracic paraspinals over 6" FR      Neck Exercises: Stretches   Upper Trapezius Stretch Right;Left;30 seconds;2 reps    Levator Stretch Right;Left;30 seconds;2 reps    Chest Stretch 60 seconds    Chest Stretch Limitations snow angel stretch over 6" FR along spine    Other Neck Stretches B rhomboid  stretch 2 x 30 sec - 2nd rep at doorway    Other Neck Stretches Thoracic extension mobilization over 6" FR            Trigger Point Dry Needling - 05/13/20 0845    Consent Given? Yes    Muscles Treated Upper Quadrant Rhomboids;Subscapularis   Bilat   Rhomboids Response Twitch response elicited;Palpable increased muscle length    Subscapularis Response Twitch response elicited;Palpable increased muscle length                PT Education - 05/13/20 0918    Education Details HEP update - FR thoracic rolling and mobilization, snow angel stretch, red TB scap retraction + horiz ABD, ER & UE diagonals over FR    Person(s) Educated Patient    Methods Explanation;Demonstration;Handout    Comprehension Verbalized understanding;Returned demonstration               PT Long Term Goals - 05/06/20 0810      PT LONG TERM GOAL #1   Title I with HEP for upper back pain relief    Time 6    Period Weeks    Status On-going    Target Date 06/10/20      PT LONG TERM GOAL #2   Title demo painfree and full thoracic rotation    Time 6    Period Weeks    Status On-going    Target Date 06/10/20      PT LONG TERM GOAL #3   Title report =/> 50% reduction of mid back pain after a work shift    Time 6    Period Weeks    Status On-going    Target Date 06/10/20      PT LONG TERM GOAL #4   Title improve Lt shoulder flexion strenght =/> 4+/5 to assist her at work.    Time 6    Period Weeks    Status On-going    Target Date 06/10/20                 Plan - 05/13/20 0850    Clinical Impression Statement [name] continues to report benefit from DN and exercise, noting that she feels like it helps with her posture, allowing her to hold her shoulders back better. Pain today mostly localized to thoracic spine and under scapulae, therefore proceeded with instruction in thoracic mobilization and rolling for thoracic paraspinals as well as anterior chest stretching and scapular  strengthening using 6" FR - pt noting positive response to these activities/exercises, therefore updated HEP provided. Manual therapy and DN targeting rhomboids and subscapularis with good twitch responses and palpable reduction in muscle tension. Session concluded with estim and  moist heat to promote further muscle relaxation.    Personal Factors and Comorbidities --    Comorbidities asthma, mammory hypertrophy, obesity,    Examination-Activity Limitations Other;Caring for Others    Examination-Participation Restrictions Other    Rehab Potential Good    PT Frequency 1x / week    PT Duration 6 weeks    PT Treatment/Interventions Iontophoresis 4mg /ml Dexamethasone;Taping;Patient/family education;Functional mobility training;Moist Heat;Traction;Ultrasound;Passive range of motion;Therapeutic exercise;Dry needling;Spinal Manipulations;Manual techniques;Electrical Stimulation;Cryotherapy    PT Next Visit Plan thoracic and cervical mobilization, upper back/scapular strengthening,  manual therapy with DN to upper back and cervical musculature as indicated, modalities PRN    PT Home Exercise Plan 6/30 - UT & LS stretrches (already doing), rhomboid stretch, red TB scap retraction + low, mid & W rows; 7/7 -  FR thoracic rolling and mobilization, snow angel stretch, red TB scap retraction + horiz ABD, ER & UE diagonals over FR    Consulted and Agree with Plan of Care Patient           Patient will benefit from skilled therapeutic intervention in order to improve the following deficits and impairments:  Decreased range of motion, Obesity, Increased muscle spasms, Pain, Hypomobility, Decreased strength  Visit Diagnosis: Pain in thoracic spine  Cervicalgia  Abnormal posture  Symptomatic mammary hypertrophy     Problem List Patient Active Problem List   Diagnosis Date Noted  . Ureteral calculus, right 12/01/2011    12/03/2011, PT, MPT 05/13/2020, 9:59 AM  [facility] [address]  [address] [place], [place], [place] Phone: [phone]   Fax:  [phone]  Name: Azyria Osmon Cripps MRN: Lloyd Huger Date of Birth: 26-Dec-1980

## 2020-05-13 NOTE — Patient Instructions (Signed)
    Home exercise program created by Rodgerick Gilliand, PT.  For questions, please contact Cristine Daw via phone at [phone] or email at [email or web address]  [facility] [place] [address]  [address] [place], [place], [address] Phone: [phone]   Fax:  [phone]    

## 2020-05-21 ENCOUNTER — Other Ambulatory Visit: Payer: Self-pay

## 2020-05-21 ENCOUNTER — Encounter: Payer: Self-pay | Admitting: Physical Therapy

## 2020-05-21 ENCOUNTER — Ambulatory Visit: Payer: 59 | Admitting: Physical Therapy

## 2020-05-21 DIAGNOSIS — M546 Pain in thoracic spine: Secondary | ICD-10-CM | POA: Diagnosis not present

## 2020-05-21 DIAGNOSIS — N62 Hypertrophy of breast: Secondary | ICD-10-CM

## 2020-05-21 DIAGNOSIS — M542 Cervicalgia: Secondary | ICD-10-CM

## 2020-05-21 DIAGNOSIS — R293 Abnormal posture: Secondary | ICD-10-CM

## 2020-05-21 NOTE — Therapy (Signed)
[facility] [address]  [address] [place], [place], [address] Phone: [phone]   Fax:  [phone]  Physical Therapy Treatment  Patient Details  Name: Carol Miller MRN: [id number] Date of Birth: 12-20-1980 Referring Provider (PT): Dr Pati Gallo   Encounter Date: 05/21/2020   PT End of Session - 05/21/20 0805    Visit Number 4    Number of Visits 6    Date for PT Re-Evaluation 06/10/20    Authorization Type UHC    PT Start Time 0805    PT Stop Time 0908    PT Time Calculation (min) 63 min    Activity Tolerance Patient tolerated treatment well    Behavior During Therapy [facility] for tasks assessed/performed           Past Medical History:  Diagnosis Date  . Asthma   . Frequency of urination   . Hematuria   . History of kidney stones   . History of seizures as a child    NONE SINCE PER PT  . Nephrolithiasis   . Right ureteral stone   . Urgency of urination     Past Surgical History:  Procedure Laterality Date  . CERVICAL CERCLAGE  10/29/2009  . COLONOSCOPY  2016  . CYSTOSCOPY/URETEROSCOPY/HOLMIUM LASER/STENT PLACEMENT Right 06/10/2016   Procedure: CYSTOSCOPY/URETEROSCOPY/HOLMIUM LASER/STENT PLACEMENT;  Surgeon: Crist Fat, MD;  Location: [facility];  Service: Urology;  Laterality: Right;  . EXTRACORPOREAL SHOCK WAVE LITHOTRIPSY Right 12/01/2011  . LAPAROSCOPIC TUBAL LIGATION Bilateral 05/14/2010   w/ fulgeration  . TONSILLECTOMY  as child  . TUBAL LIGATION      There were no vitals filed for this visit.   Subjective Assessment - 05/21/20 0805    Subjective Pt reports plan to purchase FR as she liked the exercises and stretches felt good last session.    Currently in Pain? Yes    Pain Score 2     Pain Location Thoracic    Pain Orientation Mid    Pain Descriptors / Indicators Aching   annoying   Pain Type Chronic pain    Pain Frequency Constant                              [facility] Adult PT Treatment/Exercise - 05/21/20 0805      Shoulder Exercises: ROM/Strengthening   Nustep L4 x 6 min (UE/LE)   seat & arms #10     Modalities   Modalities Electrical Stimulation;Moist Heat      Moist Heat Therapy   Moist Heat Location Cervical   thoracic     Electrical Stimulation   Electrical Stimulation Location UT/LS & upper back    Electrical Stimulation Action IFC    Electrical Stimulation Parameters 80-150 Hz, intensity to pt tol x 15'    Electrical Stimulation Goals Tone;Pain      Manual Therapy   Manual Therapy Soft tissue mobilization;Scapular mobilization    Manual therapy comments skilled palpation and monitoring during DN    Soft tissue mobilization STM to B UT, LS, rhomboids & subscapularis    Myofascial Release B scapular release    Scapular Mobilization B scapular mobs - all directions            Trigger Point Dry Needling - 05/21/20 0805    Consent Given? Yes    Muscles Treated Head and Neck Upper trapezius;Levator scapulae   bilateral  Muscles Treated Upper Quadrant Rhomboids;Subscapularis   bilateral   Upper Trapezius Response Twitch reponse elicited;Palpable increased muscle length    Levator Scapulae Response Twitch response elicited;Palpable increased muscle length    Rhomboids Response Twitch response elicited;Palpable increased muscle length    Subscapularis Response Twitch response elicited;Palpable increased muscle length                     PT Long Term Goals - 05/06/20 0810      PT LONG TERM GOAL #1   Title I with HEP for upper back pain relief    Time 6    Period Weeks    Status On-going    Target Date 06/10/20      PT LONG TERM GOAL #2   Title demo painfree and full thoracic rotation    Time 6    Period Weeks    Status On-going    Target Date 06/10/20      PT LONG TERM GOAL #3   Title report =/> 50% reduction of mid back pain after a work shift    Time 6    Period  Weeks    Status On-going    Target Date 06/10/20      PT LONG TERM GOAL #4   Title improve Lt shoulder flexion strenght =/> 4+/5 to assist her at work.    Time 6    Period Weeks    Status On-going    Target Date 06/10/20                 Plan - 05/21/20 0808    Clinical Impression Statement Carol Miller reports she has not yet obtained a foam roller but plans to order one online as she felt the exercises and mobilization using the FR last session felt good. She continues to note constant low level "annoying ache" in "upper back triangle" incorporating periscapular muscles and upper shoulders. Increased muscle tension persisting in B UT, LS, rhomboids and subscapularis which was address with extensive manual therapy incorporating DN with palpable reduction in muscle tension noted following treatment. Session concluded with estim and moist heat to promote further muscle relaxation and decrease post-DN muscle soreness.    Comorbidities asthma, mammory hypertrophy, obesity,    Examination-Activity Limitations Other;Caring for Others    Examination-Participation Restrictions Other    Rehab Potential Good    PT Frequency 1x / week    PT Duration 6 weeks    PT Treatment/Interventions Iontophoresis 4mg /ml Dexamethasone;Taping;Patient/family education;Functional mobility training;Moist Heat;Traction;Ultrasound;Passive range of motion;Therapeutic exercise;Dry needling;Spinal Manipulations;Manual techniques;Electrical Stimulation;Cryotherapy    PT Next Visit Plan thoracic and cervical mobilization, upper back/scapular strengthening,  manual therapy with DN to upper back and cervical musculature as indicated, modalities PRN    PT Home Exercise Plan 6/30 - UT & LS stretrches (already doing), rhomboid stretch, red TB scap retraction + low, mid & W rows; 7/7 -  FR thoracic rolling and mobilization, snow angel stretch, red TB scap retraction + horiz ABD, ER & UE diagonals over FR    Consulted and Agree  with Plan of Care Patient           Patient will benefit from skilled therapeutic intervention in order to improve the following deficits and impairments:  Decreased range of motion, Obesity, Increased muscle spasms, Pain, Hypomobility, Decreased strength  Visit Diagnosis: Pain in thoracic spine  Cervicalgia  Abnormal posture  Symptomatic mammary hypertrophy     Problem List Patient Active Problem List   Diagnosis Date  Noted  . Ureteral calculus, right 12/01/2011    Marry Guan, PT, MPT 05/21/2020, 9:12 AM  [facility] [address]  [address] [place], [place], [address] Phone: [phone]   Fax:  [phone]  Name: Carol Miller MRN: [id number] Date of Birth: [birth year]

## 2020-05-27 ENCOUNTER — Encounter: Payer: Self-pay | Admitting: Physical Therapy

## 2020-05-27 ENCOUNTER — Ambulatory Visit: Payer: 59 | Admitting: Physical Therapy

## 2020-05-27 ENCOUNTER — Other Ambulatory Visit: Payer: Self-pay

## 2020-05-27 DIAGNOSIS — R293 Abnormal posture: Secondary | ICD-10-CM

## 2020-05-27 DIAGNOSIS — N62 Hypertrophy of breast: Secondary | ICD-10-CM

## 2020-05-27 DIAGNOSIS — M546 Pain in thoracic spine: Secondary | ICD-10-CM

## 2020-05-27 DIAGNOSIS — M542 Cervicalgia: Secondary | ICD-10-CM

## 2020-05-27 NOTE — Therapy (Signed)
[facility] [address]  [address] [place], [place], [address] Phone: [phone]   Fax:  [phone]  Physical Therapy Treatment  Patient Details  Name: Carol Miller MRN: [id number] Date of Birth: 09-03-1981 Referring Provider (PT): Dr Pati Gallo   Encounter Date: 05/27/2020   PT End of Session - 05/27/20 0812    Visit Number 5    Number of Visits 6    Date for PT Re-Evaluation 06/10/20    Authorization Type UHC    PT Start Time 0812    PT Stop Time 0850    PT Time Calculation (min) 38 min    Activity Tolerance Patient tolerated treatment well    Behavior During Therapy [facility] for tasks assessed/performed           Past Medical History:  Diagnosis Date  . Asthma   . Frequency of urination   . Hematuria   . History of kidney stones   . History of seizures as a child    NONE SINCE PER PT  . Nephrolithiasis   . Right ureteral stone   . Urgency of urination     Past Surgical History:  Procedure Laterality Date  . CERVICAL CERCLAGE  10/29/2009  . COLONOSCOPY  2016  . CYSTOSCOPY/URETEROSCOPY/HOLMIUM LASER/STENT PLACEMENT Right 06/10/2016   Procedure: CYSTOSCOPY/URETEROSCOPY/HOLMIUM LASER/STENT PLACEMENT;  Surgeon: Crist Fat, MD;  Location[facility];  Service: Urology;  Laterality: Right;  . EXTRACORPOREAL SHOCK WAVE LITHOTRIPSY Right 12/01/2011  . LAPAROSCOPIC TUBAL LIGATION Bilateral 05/14/2010   w/ fulgeration  . TONSILLECTOMY  as child  . TUBAL LIGATION      There were no vitals filed for this visit.   Subjective Assessment - 05/27/20 0816    Subjective Pt reports she purchased the foam roller and feels like this is working well for her.    Currently in Pain? Yes    Pain Score 3     Pain Location Thoracic    Pain Orientation Mid    Pain Descriptors / Indicators Aching                             [facility] Adult PT Treatment/Exercise - 05/27/20 0812       Shoulder Exercises: ROM/Strengthening   UBE (Upper Arm Bike) L2.5 x 4' (2' fwd/2' back)      Manual Therapy   Manual Therapy Soft tissue mobilization;Other (comment)    Manual therapy comments skilled palpation and monitoring during DN    Soft tissue mobilization STM to B UT, LS, MT, rhomboids    Other Manual Therapy Provided instruction in use of Theracane and/or tennis ball on wall for self-STM to thoracic and periscapular muscles            Trigger Point Dry Needling - 05/27/20 6789    Consent Given? Yes    Muscles Treated Head and Neck Upper trapezius;Levator scapulae   bilateral   Muscles Treated Upper Quadrant Rhomboids   Rt   Upper Trapezius Response Twitch reponse elicited;Palpable increased muscle length    Levator Scapulae Response Twitch response elicited;Palpable increased muscle length    Rhomboids Response Twitch response elicited;Palpable increased muscle length                PT Education - 05/27/20 0850    Education Details Green TB provided for HEP prrogression; Instruction in self-STM/TPR using Theracane and/or tennis ball on wall  Person(s) Educated Patient    Methods Explanation;Demonstration    Comprehension Verbalized understanding;Returned demonstration               PT Long Term Goals - 05/06/20 0810      PT LONG TERM GOAL #1   Title I with HEP for upper back pain relief    Time 6    Period Weeks    Status On-going    Target Date 06/10/20      PT LONG TERM GOAL #2   Title demo painfree and full thoracic rotation    Time 6    Period Weeks    Status On-going    Target Date 06/10/20      PT LONG TERM GOAL #3   Title report =/> 50% reduction of mid back pain after a work shift    Time 6    Period Weeks    Status On-going    Target Date 06/10/20      PT LONG TERM GOAL #4   Title improve Lt shoulder flexion strenght =/> 4+/5 to assist her at work.    Time 6    Period Weeks    Status On-going    Target Date 06/10/20                   Plan - 05/27/20 0850    Clinical Impression Statement Session limited today due to pt late arrival. Carol Miller reports ongoing "tension" in upper back mostly localized to UT and LS muscles currently - requesting DN for this as she notes continued benefit with palpable reduction in muscle tension noted following manual therapy and DN. Pt reporting she has obtained a foam roller and feels that this is helping with self-STM and thoracic mobilization but also provided instruction in use of Theracane and/or tennis ball for more localized self-STM/TPR. Pt also notes that some of current HEP starting to feel more "basic" and may benefit from progression of HEP next visit, however green TB provided for progression of resistance today. POC currently set to finish on next visit, hence will also plan to assess goals and determine need for potential POC extension at that time.    Comorbidities asthma, mammory hypertrophy, obesity,    Examination-Activity Limitations Other;Caring for Others    Examination-Participation Restrictions Other    Rehab Potential Good    PT Frequency 1x / week    PT Duration 6 weeks    PT Treatment/Interventions Iontophoresis 4mg /ml Dexamethasone;Taping;Patient/family education;Functional mobility training;Moist Heat;Traction;Ultrasound;Passive range of motion;Therapeutic exercise;Dry needling;Spinal Manipulations;Manual techniques;Electrical Stimulation;Cryotherapy    PT Next Visit Plan Goal assessment with recert vs transition to HEP after HEP review/update; thoracic and cervical mobilization, upper back/scapular strengthening, manual therapy with DN to upper back and cervical musculature as indicated, modalities PRN    PT Home Exercise Plan 6/30 - UT & LS stretrches (already doing), rhomboid stretch, red TB scap retraction + low, mid & W rows; 7/7 - FR thoracic rolling and mobilization, snow angel stretch, red TB scap retraction + horiz ABD, ER & UE diagonals over FR     Consulted and Agree with Plan of Care Patient           Patient will benefit from skilled therapeutic intervention in order to improve the following deficits and impairments:  Decreased range of motion, Obesity, Increased muscle spasms, Pain, Hypomobility, Decreased strength  Visit Diagnosis: Pain in thoracic spine  Cervicalgia  Abnormal posture  Symptomatic mammary hypertrophy     Problem List Patient Active  Problem List   Diagnosis Date Noted  . Ureteral calculus, right 12/01/2011    Marry Guan, PT, MPT 05/27/2020, 1:46 PM  [facility] [address]  [address] [place], [place], [address] Phone: [phone]   Fax:  [phone]  Name: Carol Miller MRN: [id number] Date of Birth: 1981/01/21

## 2020-06-03 ENCOUNTER — Ambulatory Visit: Payer: 59 | Admitting: Physical Therapy

## 2020-06-03 ENCOUNTER — Other Ambulatory Visit: Payer: Self-pay

## 2020-06-03 ENCOUNTER — Encounter: Payer: Self-pay | Admitting: Physical Therapy

## 2020-06-03 DIAGNOSIS — M542 Cervicalgia: Secondary | ICD-10-CM

## 2020-06-03 DIAGNOSIS — R293 Abnormal posture: Secondary | ICD-10-CM

## 2020-06-03 DIAGNOSIS — M546 Pain in thoracic spine: Secondary | ICD-10-CM | POA: Diagnosis not present

## 2020-06-03 DIAGNOSIS — N62 Hypertrophy of breast: Secondary | ICD-10-CM

## 2020-06-03 NOTE — Therapy (Signed)
[facility] [address]  [place], [place], [address] Phone: [phone]   Fax:  [phone]  Physical Therapy Treatment / Recert  Patient Details  Name: Carol Miller Conti MRN: [id number] Date of Birth: Jul 13, 1981 Referring Provider (PT): Dr Berle Mull   Encounter Date: 06/03/2020   PT End of Session - 06/03/20 0811    Visit Number 6    Number of Visits 12    Date for PT Re-Evaluation 07/15/20    Authorization Type UHC    PT Start Time 0808    PT Stop Time 0846    PT Time Calculation (min) 38 min    Activity Tolerance Patient tolerated treatment well    Behavior During Therapy [facility] for tasks assessed/performed           Past Medical History:  Diagnosis Date  . Asthma   . Frequency of urination   . Hematuria   . History of kidney stones   . History of seizures as a child    NONE SINCE PER PT  . Nephrolithiasis   . Right ureteral stone   . Urgency of urination     Past Surgical History:  Procedure Laterality Date  . CERVICAL CERCLAGE  10/29/2009  . COLONOSCOPY  2016  . CYSTOSCOPY/URETEROSCOPY/HOLMIUM LASER/STENT PLACEMENT Right 06/10/2016   Procedure: CYSTOSCOPY/URETEROSCOPY/HOLMIUM LASER/STENT PLACEMENT;  Surgeon: Ardis Hughs, MD;  Location: [facility];  Service: Urology;  Laterality: Right;  . EXTRACORPOREAL SHOCK WAVE LITHOTRIPSY Right 12/01/2011  . LAPAROSCOPIC TUBAL LIGATION Bilateral 05/14/2010   w/ fulgeration  . TONSILLECTOMY  as child  . TUBAL LIGATION      There were no vitals filed for this visit.   Subjective Assessment - 06/03/20 0810    Subjective Pt notes benefit from PT thus far with decreased pain and decreased feeling of "bulkiness" in upper back/lower neck. Has not yet tried progressing HEP to green TB provided last visit.    Currently in Pain? Yes    Pain Score 2     Pain Location Thoracic    Pain Orientation Mid    Pain Descriptors / Indicators Aching     Pain Type Chronic pain    Pain Frequency Intermittent              [facility] PT Assessment - 06/03/20 0808      Assessment   Medical Diagnosis Cervical myofascial pain syndrome    Referring Provider (PT) Dr Berle Mull    Onset Date/Surgical Date --   many yrs ago   Hand Dominance Right    Next MD Visit PRN      AROM   Right/Left Shoulder --   WNL   Cervical Flexion WFL but pulling on R posterior neck/upper back with chin to chest    Cervical Extension WNL    Cervical - Right Side Bend WFL - mild UT tightness    Cervical - Left Side Bend WNL    Cervical - Right Rotation WNL    Cervical - Left Rotation WNL    Thoracic Flexion WNL    Thoracic Extension WNL - no pain    Thoracic - Right Rotation WNL - no pain    Thoracic - Left Rotation WNL - no pain      Strength   Right Shoulder Flexion 4/5    Right Shoulder Extension 5/5    Right Shoulder ABduction 4/5    Right Shoulder Internal Rotation 5/5  Right Shoulder External Rotation 5/5    Right Shoulder Horizontal ABduction 4/5    Right Shoulder Horizontal ADduction 4+/5    Left Shoulder Flexion 4/5    Left Shoulder Extension 5/5    Left Shoulder ABduction 4-/5   pain at shoulder   Left Shoulder Internal Rotation 4-/5   pain at anterior shoulder   Left Shoulder External Rotation 4+/5    Left Shoulder Horizontal ABduction 4/5    Left Shoulder Horizontal ADduction 4/5                         [facility] Adult PT Treatment/Exercise - 06/03/20 0808      Shoulder Exercises: Standing   Horizontal ABduction Both;15 reps;Strengthening;Theraband    Theraband Level (Shoulder Horizontal ABduction) Level 2 (Red)    Horizontal ABduction Limitations standing with spine against 6" FR to promote increased scap retraction    External Rotation Both;10 reps;Strengthening;Theraband    Theraband Level (Shoulder External Rotation) Level 2 (Red)    External Rotation Limitations standing with spine against 6" FR to promote  increased scap retraction    Diagonals Both;10 reps;Strengthening;Theraband    Theraband Level (Shoulder Diagonals) Level 2 (Red)    Diagonals Limitations standing with spine against 6" FR to promote increased scap retraction      Shoulder Exercises: ROM/Strengthening   Other ROM/Strengthening Exercises Ellipitical - L2.0 x 6 min      Shoulder Exercises: Stretch   Corner Stretch 30 seconds;3 reps    Corner Stretch Limitations 3-way doorway pec stretch                  PT Education - 06/03/20 [phone]    Education Details progression of hooklying scapular strengthening to upright position using FR against wall or doorframe for tactile feedback    Person(s) Educated Patient    Methods Explanation;Demonstration;Handout    Comprehension Verbalized understanding;Returned demonstration;Need further instruction               PT Long Term Goals - 06/03/20 7741      PT LONG TERM GOAL #1   Title I with HEP for upper back pain relief    Status Partially Met   06/03/20 - met for current HEP   Target Date 07/15/20      PT LONG TERM GOAL #2   Title demo painfree and full thoracic rotation    Status Achieved   06/03/20     PT LONG TERM GOAL #3   Title report =/> 50% reduction of mid back pain after a work shift    Status Partially Met   06/03/20 - notes 30% reduction in pain after working normal shift, but dependent upon degree of pt handling during shift   Target Date 07/15/20      PT LONG TERM GOAL #4   Title improve Lt shoulder flexion strenght =/> 4+/5 to assist her at work.    Status Partially Met   06/03/20   Target Date 07/15/20                 Plan - 06/03/20 0813    Clinical Impression Statement Jamecia notes benefit from PT thus far with decreased pain and decreased feeling of "bulkiness" in upper back/lower neck as well as 30% reduction in pain after working normal shift, although this is somewhat dependent upon degree of pt handling during her shift. Her  posture has improved, and her cervical and thoracic ROM is now WNL/WFL with  no pain and only mild tightness/pulling noted with cervical flexion and R side bending. B shoulder strength grossly 4/5 to 5/5 with exceptions of L shoulder ABD and IR limited to 4-/5 due to pain in anterior shoulder with resisted motion - taut tender bands identified in R anterior deltoid which may benefit from DN next visit. Good progress observed toward goals with all goals at least partially met and thoracic ROM goal met. Given progress with PT and ongoing deficits, will recommend recert for continued PT 1x/wk x 4-6 weeks with pt in agreement.    Comorbidities asthma, mammory hypertrophy, obesity    Examination-Activity Limitations Other;Caring for Others    Examination-Participation Restrictions Other    Rehab Potential Good    PT Frequency 1x / week    PT Duration 6 weeks    PT Treatment/Interventions Iontophoresis 73m/ml Dexamethasone;Taping;Patient/family education;Functional mobility training;Moist Heat;Traction;Ultrasound;Passive range of motion;Therapeutic exercise;Dry needling;Spinal Manipulations;Manual techniques;Electrical Stimulation;Cryotherapy    PT Next Visit Plan DN for L shoulder; thoracic and cervical mobilization, upper back/scapular/shoulder  strengthening, manual therapy with DN to upper back and cervical musculature as indicated, modalities PRN    PT Home Exercise Plan 6/30 - UT & LS stretrches (already doing), rhomboid stretch, red TB scap retraction + low, mid & W rows; 7/7 - FR thoracic rolling and mobilization, snow angel stretch, red TB scap retraction + horiz ABD, ER & UE diagonals over FR    Consulted and Agree with Plan of Care Patient           Patient will benefit from skilled therapeutic intervention in order to improve the following deficits and impairments:  Decreased range of motion, Obesity, Increased muscle spasms, Pain, Hypomobility, Decreased strength  Visit Diagnosis: Pain in  thoracic spine  Cervicalgia  Abnormal posture  Symptomatic mammary hypertrophy     Problem List Patient Active Problem List   Diagnosis Date Noted  . Ureteral calculus, right 12/01/2011    JPercival Spanish PT, MPT 06/03/2020, 1:57 PM  [facility] [place] [address]Phone: [phone]  Fax:  [phone] Name: COlena WillyFree MRN: [id number] of Birth: 8March 11, 1982

## 2020-06-10 ENCOUNTER — Ambulatory Visit: Payer: 59 | Admitting: Physical Therapy

## 2020-06-19 ENCOUNTER — Ambulatory Visit: Payer: 59 | Attending: Sports Medicine | Admitting: Physical Therapy

## 2020-06-19 DIAGNOSIS — N62 Hypertrophy of breast: Secondary | ICD-10-CM | POA: Insufficient documentation

## 2020-06-19 DIAGNOSIS — M546 Pain in thoracic spine: Secondary | ICD-10-CM | POA: Insufficient documentation

## 2020-06-19 DIAGNOSIS — R293 Abnormal posture: Secondary | ICD-10-CM | POA: Insufficient documentation

## 2020-06-19 DIAGNOSIS — M542 Cervicalgia: Secondary | ICD-10-CM | POA: Insufficient documentation

## 2020-07-02 ENCOUNTER — Ambulatory Visit: Payer: 59 | Admitting: Physical Therapy

## 2020-07-02 ENCOUNTER — Encounter: Payer: Self-pay | Admitting: Physical Therapy

## 2020-07-02 ENCOUNTER — Other Ambulatory Visit: Payer: Self-pay

## 2020-07-02 DIAGNOSIS — N62 Hypertrophy of breast: Secondary | ICD-10-CM | POA: Diagnosis present

## 2020-07-02 DIAGNOSIS — R293 Abnormal posture: Secondary | ICD-10-CM

## 2020-07-02 DIAGNOSIS — M546 Pain in thoracic spine: Secondary | ICD-10-CM | POA: Diagnosis present

## 2020-07-02 DIAGNOSIS — M542 Cervicalgia: Secondary | ICD-10-CM | POA: Diagnosis present

## 2020-07-02 NOTE — Therapy (Signed)
[facility] [address]  [place], [place], [address] Phone: [phone]   Fax:  [phone]  Physical Therapy Treatment  Patient Details  Name: Carol Miller MRN: [id number] Date of Birth: 05/10/1981 Referring Provider (PT): Dr Berle Mull   Encounter Date: 07/02/2020   PT End of Session - 07/02/20 0807    Visit Number 7    Number of Visits 12    Date for PT Re-Evaluation 07/15/20    Authorization Type UHC    PT Start Time 0807   Pt arrived late   PT Stop Time 0904    PT Time Calculation (min) 57 min    Activity Tolerance Patient tolerated treatment well    Behavior During Therapy [facility] for tasks assessed/performed           Past Medical History:  Diagnosis Date  . Asthma   . Frequency of urination   . Hematuria   . History of kidney stones   . History of seizures as a child    NONE SINCE PER PT  . Nephrolithiasis   . Right ureteral stone   . Urgency of urination     Past Surgical History:  Procedure Laterality Date  . CERVICAL CERCLAGE  10/29/2009  . COLONOSCOPY  2016  . CYSTOSCOPY/URETEROSCOPY/HOLMIUM LASER/STENT PLACEMENT Right 06/10/2016   Procedure: CYSTOSCOPY/URETEROSCOPY/HOLMIUM LASER/STENT PLACEMENT;  Surgeon: Ardis Hughs, MD;  Location: [facility];  Service: Urology;  Laterality: Right;  . EXTRACORPOREAL SHOCK WAVE LITHOTRIPSY Right 12/01/2011  . LAPAROSCOPIC TUBAL LIGATION Bilateral 05/14/2010   w/ fulgeration  . TONSILLECTOMY  as child  . TUBAL LIGATION      There were no vitals filed for this visit.   Subjective Assessment - 07/02/20 0810    Subjective Pt reports she had a flare-up last week - not sure of trigger. Received a steriod & toradol injections - noted relief for ~1 day then went back to work and pain back to excrutiating. Sent for MRI which revealed bulging disc so she has been referred to neurosurgeon - awaiting appointment.    Currently in Pain?  Yes    Pain Score 6     Pain Location Thoracic    Pain Orientation Mid    Pain Descriptors / Indicators Sharp   "pulling"   Pain Type Chronic pain    Pain Frequency Constant                             [facility] Adult PT Treatment/Exercise - 07/02/20 0807      Shoulder Exercises: ROM/Strengthening   UBE (Upper Arm Bike) L2.5 x 6' (3' fwd/3' back)      Modalities   Modalities Electrical Stimulation;Moist Heat      Moist Heat Therapy   Number Minutes Moist Heat 15 Minutes    Moist Heat Location Cervical   thoracic     Electrical Stimulation   Electrical Stimulation Location UT/LS & upper back    Electrical Stimulation Action IFC    Electrical Stimulation Parameters 80-150 Hz, intensity to pt tol x 15'    Electrical Stimulation Goals Tone;Pain      Manual Therapy   Manual Therapy Soft tissue mobilization;Myofascial release    Manual therapy comments skilled palpation and monitoring during DN    Soft tissue mobilization STM to B UT, LS, rhomboids & subscapularis    Myofascial Release B scapular release  Trigger Point Dry Needling - 07/02/20 7893    Consent Given? Yes    Muscles Treated Head and Neck Upper trapezius;Levator scapulae   bilateral   Muscles Treated Upper Quadrant Rhomboids;Subscapularis   bilateral   Upper Trapezius Response Twitch reponse elicited;Palpable increased muscle length    Levator Scapulae Response Twitch response elicited;Palpable increased muscle length    Rhomboids Response Twitch response elicited;Palpable increased muscle length    Subscapularis Response Twitch response elicited;Palpable increased muscle length                     PT Long Term Goals - 06/03/20 8101      PT LONG TERM GOAL #1   Title I with HEP for upper back pain relief    Status Partially Met   06/03/20 - met for current HEP   Target Date 07/15/20      PT LONG TERM GOAL #2   Title demo painfree and full thoracic rotation    Status  Achieved   06/03/20     PT LONG TERM GOAL #3   Title report =/> 50% reduction of mid back pain after a work shift    Status Partially Met   06/03/20 - notes 30% reduction in pain after working normal shift, but dependent upon degree of pt handling during shift   Target Date 07/15/20      PT LONG TERM GOAL #4   Title improve Lt shoulder flexion strenght =/> 4+/5 to assist her at work.    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  She reports an MRI was done which revealed a bulging disc and has a consultation with a neurosurgeon pending, with referring MD mentioning potential ESI. Pain currently t/o upper shoulders and back with greatest pain and ttp in R>L UT/LS and L>R periscapular muscles - addressed with MT incorporating DN with good twitch responses elicited resulting in decrease ttp and palpable reduction in muscle tension. Pt reports she has not been preforming HEP other than stretches since pain exacerbation, therefore encouraged pt to ease back into postural strengthening exercises in addition to continuing with stretches. Session concluded with estim and moist heat to promote further muscle relaxation with pt noting good relief. Pt also encouraged to use TENS unit at home for pain management PRN.    Comorbidities asthma, mammory hypertrophy, obesity    Examination-Activity Limitations Other;Caring for Others    Examination-Participation Restrictions Other    Rehab Potential Good    PT Frequency 1x / week    PT Duration 6 weeks    PT Treatment/Interventions Iontophoresis 65m/ml Dexamethasone;Taping;Patient/family education;Functional mobility training;Moist Heat;Traction;Ultrasound;Passive range of  motion;Therapeutic exercise;Dry needling;Spinal Manipulations;Manual techniques;Electrical Stimulation;Cryotherapy    PT Next Visit Plan DN for L shoulder; thoracic and cervical mobilization, upper back/scapular/shoulder  strengthening, manual therapy with DN to upper back and cervical musculature as indicated, modalities PRN    PT Home Exercise Plan 6/30 - UT & LS stretrches (already doing), rhomboid stretch, red TB scap retraction + low, mid & W rows; 7/7 - FR thoracic rolling and mobilization, snow angel stretch, red TB scap retraction + horiz ABD,  ER & UE diagonals over FR    Consulted and Agree with Plan of Care Patient           Patient will benefit from skilled therapeutic intervention in order to improve the following deficits and impairments:  Decreased range of motion, Obesity, Increased muscle spasms, Pain, Hypomobility, Decreased strength  Visit Diagnosis: Pain in thoracic spine  Cervicalgia  Abnormal posture  Symptomatic mammary hypertrophy     Problem List Patient Active Problem List   Diagnosis Date Noted  . Ureteral calculus, right 12/01/2011    Percival Spanish, PT, MPT 07/02/2020, 12:14 PM  [facility] [address]  [place], [place], [address] Phone: [phone]   Fax:  [phone]  Name: Tanieka Pownall Crowson MRN: [id number] Date of Birth: [birth year]

## 2020-07-14 ENCOUNTER — Other Ambulatory Visit: Payer: Self-pay

## 2020-07-14 ENCOUNTER — Ambulatory Visit: Payer: 59 | Attending: Neurosurgery | Admitting: Physical Therapy

## 2020-07-14 ENCOUNTER — Encounter: Payer: Self-pay | Admitting: Physical Therapy

## 2020-07-14 DIAGNOSIS — R293 Abnormal posture: Secondary | ICD-10-CM | POA: Insufficient documentation

## 2020-07-14 DIAGNOSIS — N62 Hypertrophy of breast: Secondary | ICD-10-CM | POA: Insufficient documentation

## 2020-07-14 DIAGNOSIS — M546 Pain in thoracic spine: Secondary | ICD-10-CM | POA: Insufficient documentation

## 2020-07-14 DIAGNOSIS — M542 Cervicalgia: Secondary | ICD-10-CM | POA: Diagnosis present

## 2020-07-14 NOTE — Therapy (Signed)
[facility] [address]  [place], [place], [address] Phone: [phone]   Fax:  [phone]  Physical Therapy Treatment / Recert  Patient Details  Name: Carol Miller MRN: [id number] Date of Birth: 08/10/1981 Referring Provider (PT): Berle Mull, MD & Ashok Pall, MD   Encounter Date: 07/14/2020   PT End of Session - 07/14/20 0809    Visit Number 8    Number of Visits 16    Date for PT Re-Evaluation 08/14/20    Authorization Type UHC    PT Start Time 0809   Pt arrived late   PT Stop Time 0905    PT Time Calculation (min) 56 min    Activity Tolerance Patient tolerated treatment well    Behavior During Therapy [facility] for tasks assessed/performed           Past Medical History:  Diagnosis Date  . Asthma   . Frequency of urination   . Hematuria   . History of kidney stones   . History of seizures as a child    NONE SINCE PER PT  . Nephrolithiasis   . Right ureteral stone   . Urgency of urination     Past Surgical History:  Procedure Laterality Date  . CERVICAL CERCLAGE  10/29/2009  . COLONOSCOPY  2016  . CYSTOSCOPY/URETEROSCOPY/HOLMIUM LASER/STENT PLACEMENT Right 06/10/2016   Procedure: CYSTOSCOPY/URETEROSCOPY/HOLMIUM LASER/STENT PLACEMENT;  Surgeon: Ardis Hughs, MD;  Location: [facility];  Service: Urology;  Laterality: Right;  . EXTRACORPOREAL SHOCK WAVE LITHOTRIPSY Right 12/01/2011  . LAPAROSCOPIC TUBAL LIGATION Bilateral 05/14/2010   w/ fulgeration  . TONSILLECTOMY  as child  . TUBAL LIGATION      There were no vitals filed for this visit.   Subjective Assessment - 07/14/20 0819    Subjective Pt reports she was referred to neurosurgery (Dr. Christella Noa) who requested to add traction to [place] (new order received).    Currently in Pain? Yes    Pain Score 2     Pain Location Thoracic    Pain Orientation Mid    Pain Descriptors / Indicators Aching   "pinching"   Pain Type  Chronic pain    Pain Radiating Towards pain into L shoulder    Pain Onset More than a month ago    Pain Frequency Constant    Aggravating Factors  overexertion - pulling on something heavy    Pain Relieving Factors stretching, Celebrex, DN              [facility] PT Assessment - 07/14/20 0809      Assessment   Medical Diagnosis Cervical myofascial pain syndrome    Referring Provider (PT) Berle Mull, MD & Ashok Pall, MD    Onset Date/Surgical Date --   many yrs ago   Hand Dominance Right    Next MD Visit 07/27/20 -  Dr. 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    Leisure read research, hike      AROM   Cervical Flexion 35 - pulling & tight in postierior neck & upper shoulders    Cervical Extension WNL - 66    Cervical - Right Side Bend WNL - 44    Cervical - Left Side Bend WNL - 41    Cervical - Right Rotation WFL - 60  Cervical - Left Rotation WFL - 60    Thoracic Flexion WNL    Thoracic Extension WNL - no pain    Thoracic - Right Rotation WNL - no pain    Thoracic - Left Rotation WNL - no pain      Strength   Right Shoulder Flexion 4+/5    Right Shoulder Extension 5/5    Right Shoulder ABduction 4/5    Right Shoulder Internal Rotation 5/5    Right Shoulder External Rotation 5/5    Right Shoulder Horizontal ABduction 4+/5    Right Shoulder Horizontal ADduction 4+/5    Left Shoulder Flexion 4-/5   pain at shoulder   Left Shoulder Extension 5/5    Left Shoulder ABduction 4-/5   pain at shoulder   Left Shoulder Internal Rotation 4+/5    Left Shoulder External Rotation 4+/5    Left Shoulder Horizontal ABduction --   4+/5   Left Shoulder Horizontal ADduction 4+/5                         [facility] Adult PT Treatment/Exercise - 07/14/20 0809      Shoulder Exercises: ROM/Strengthening   UBE (Upper Arm Bike) L2.0 x 6' (3' fwd/3' back)       Modalities   Modalities Traction      Traction   Type of Traction Cervical    Min (lbs) 10    Max (lbs) 16    Hold Time 60    Rest Time 20    Time 10      Manual Therapy   Manual Therapy Manual Traction    Manual Traction 5 x 30 sec                       PT Long Term Goals - 07/14/20 0819      PT LONG TERM GOAL #1   Title I with HEP for upper back pain relief    Status Partially Met   07/14/20 - met for current HEP   Target Date 08/14/20      PT LONG TERM GOAL #2   Title Pt will demo painfree and full thoracic rotation    Status Achieved   06/03/20     PT LONG TERM GOAL #3   Title Pt will report =/> 70% reduction of mid back pain after a work shift    Baseline report =/> 50% reduction of mid back pain after a work shift - met as of 07/14/20    Status Revised   07/14/20 - notes 50% reduction in pain after working normal shift, but dependent upon degree of pt handling during shift   Target Date 08/14/20      PT LONG TERM GOAL #4   Title improve Lt shoulder strength =/> 4+/5 to assist her at work.    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 Her postural awareness has improved but continued tension/pain noted in upper back, periscapular muscles and L shoulder. Cervical and thoracic ROM mostly WFL/WNL other than mild limitation in cervical flexion and rotation with no pain reported but some pulling/tightness noted. Shoulder strength continues to  improve with only ongoing weakness and muscle fatigue noted with L>R flexion and abduction. Carol Miller reports her neurosurgeon would like her to continue with PT adding cervical traction to current POC (positive response noted with initial trial of traction today), therefore will recommend recert for additional 2x/wk x 4 weeks to address ongoing deficits and response to cervical traction. If benefit noted from traction, may consider referral for home traction unit.    Comorbidities asthma, mammory hypertrophy, obesity    Examination-Activity Limitations Other;Caring for Others    Examination-Participation Restrictions Other    Rehab Potential Good    PT Frequency 2x / week    PT Duration 4 weeks    PT Treatment/Interventions Iontophoresis 9m/ml Dexamethasone;Taping;Patient/family education;Functional mobility training;Moist Heat;Traction;Ultrasound;Passive range of motion;Therapeutic exercise;Dry needling;Spinal Manipulations;Manual techniques;Electrical Stimulation;Cryotherapy;Neuromuscular re-education;ADLs/Self Care Home Management    PT Next Visit Plan assess long term response to traction; thoracic and cervical mobilization, upper back/scapular/shoulder strengthening, manual therapy with DN for cervicothoracic paraspinals & L shoulder as indicated, cervical traction; modalities PRN    PT Home Exercise Plan 6/30 - UT & LS stretrches (already doing), rhomboid stretch, red TB scap retraction + low, mid & W rows; 7/7 - FR thoracic rolling and mobilization, snow angel stretch, red TB scap retraction + horiz ABD, ER & UE diagonals over FR    Recommended Other Services possible home cervical traction unit    Consulted and Agree with Plan of Care Patient           Patient will benefit from skilled therapeutic intervention in order to improve the following deficits and impairments:  Decreased range of motion, Obesity, Increased muscle spasms, Pain, Hypomobility, Decreased strength  Visit  Diagnosis: Pain in thoracic spine - Plan: PT plan of care cert/re-cert, PT plan of care cert/re-cert  Cervicalgia - Plan: PT plan of care cert/re-cert, PT plan of care cert/re-cert  Abnormal posture - Plan: PT plan of care cert/re-cert, PT plan of care cert/re-cert  Symptomatic mammary hypertrophy - Plan: PT plan of care cert/re-cert, PT plan of care cert/re-cert     Problem List Patient Active Problem List   Diagnosis Date Noted  . Ureteral calculus, right 12/01/2011    JPercival Spanish PT, MPT 07/14/2020, 12:42 PM  [facility] [place] [address]Phone: [phone]  Fax:  [phone] Name: CNyhla MountjoyFree MRN: [id number] of Birth: 809/23/1982

## 2020-07-20 ENCOUNTER — Encounter: Payer: Self-pay | Admitting: Physical Therapy

## 2020-07-20 ENCOUNTER — Other Ambulatory Visit: Payer: Self-pay

## 2020-07-20 ENCOUNTER — Ambulatory Visit: Payer: 59 | Admitting: Physical Therapy

## 2020-07-20 DIAGNOSIS — M546 Pain in thoracic spine: Secondary | ICD-10-CM

## 2020-07-20 DIAGNOSIS — N62 Hypertrophy of breast: Secondary | ICD-10-CM

## 2020-07-20 DIAGNOSIS — R293 Abnormal posture: Secondary | ICD-10-CM

## 2020-07-20 DIAGNOSIS — M542 Cervicalgia: Secondary | ICD-10-CM

## 2020-07-20 NOTE — Therapy (Signed)
[facility] [address]  [place], [place], [address] Phone: [phone]   Fax:  [phone]  Physical Therapy Treatment  Patient Details  Name: Carol Miller MRN: [id number] Date of Birth: August 26, 1981 Referring Provider (PT): Berle Mull, MD & Ashok Pall, MD   Encounter Date: 07/20/2020   PT End of Session - 07/20/20 0806    Visit Number 9    Number of Visits 16    Date for PT Re-Evaluation 08/14/20    Authorization Type UHC    PT Start Time 0806    PT Stop Time 0901    PT Time Calculation (min) 55 min    Activity Tolerance Patient tolerated treatment well    Behavior During Therapy [facility] for tasks assessed/performed           Past Medical History:  Diagnosis Date  . Asthma   . Frequency of urination   . Hematuria   . History of kidney stones   . History of seizures as a child    NONE SINCE PER PT  . Nephrolithiasis   . Right ureteral stone   . Urgency of urination     Past Surgical History:  Procedure Laterality Date  . CERVICAL CERCLAGE  10/29/2009  . COLONOSCOPY  2016  . CYSTOSCOPY/URETEROSCOPY/HOLMIUM LASER/STENT PLACEMENT Right 06/10/2016   Procedure: CYSTOSCOPY/URETEROSCOPY/HOLMIUM LASER/STENT PLACEMENT;  Surgeon: Ardis Hughs, MD;  Location: [facility];  Service: Urology;  Laterality: Right;  . EXTRACORPOREAL SHOCK WAVE LITHOTRIPSY Right 12/01/2011  . LAPAROSCOPIC TUBAL LIGATION Bilateral 05/14/2010   w/ fulgeration  . TONSILLECTOMY  as child  . TUBAL LIGATION      There were no vitals filed for this visit.   Subjective Assessment - 07/20/20 0810    Subjective Pt reports mechanical traction "felt really good" last visit.    Currently in Pain? Yes    Pain Score 2     Pain Location Thoracic    Pain Orientation Mid    Pain Descriptors / Indicators Aching    Pain Type Chronic pain    Pain Frequency Constant                             [facility]  Adult PT Treatment/Exercise - 07/20/20 0806      Shoulder Exercises: ROM/Strengthening   UBE (Upper Arm Bike) L2.5 x 6' (3' fwd/3' back)      Traction   Type of Traction Cervical    Min (lbs) 14    Max (lbs) 20    Hold Time 60    Rest Time 20    Time 12      Manual Therapy   Manual Therapy Soft tissue mobilization;Myofascial release    Manual therapy comments skilled palpation and monitoring during DN    Soft tissue mobilization STM to B cervical multifidi, scalenes, UT, LS, R rhomboids & subscapularis    Myofascial Release manual TPR to R scalenes, LS, rhomboids & subscapularis; R scapular release            Trigger Point Dry Needling - 07/20/20 0806    Consent Given? Yes    Muscles Treated Head and Neck Cervical multifidi;Scalenes;Upper trapezius;Levator scapulae    Muscles Treated Upper Quadrant Rhomboids;Subscapularis    Upper Trapezius Response Twitch reponse elicited;Palpable increased muscle length   bilateral   Levator Scapulae Response Twitch response elicited;Palpable increased muscle length   Rt   Cervical  multifidi Response Palpable increased muscle length;Twitch reponse elicited   bilateral   Rhomboids Response Twitch response elicited;Palpable increased muscle length   Rt   Subscapularis Response Twitch response elicited;Palpable increased muscle length   Rt                    PT Long Term Goals - 07/20/20 6734      PT LONG TERM GOAL #1   Title I with HEP for upper back pain relief    Status Partially Met   07/14/20 - met for current HEP   Target Date 08/14/20      PT LONG TERM GOAL #2   Title Pt will demo painfree and full thoracic rotation    Status Achieved   06/03/20     PT LONG TERM GOAL #3   Title Pt will report =/> 70% reduction of mid back pain after a work shift    Baseline report =/> 50% reduction of mid back pain after a work shift - met as of 07/14/20    Status Revised   07/14/20 - notes 50% reduction in pain after working normal shift,  but dependent upon degree of pt handling during shift   Target Date 08/14/20      PT LONG TERM GOAL #4   Title improve Lt shoulder strength =/> 4+/5 to assist her at work.    Status Partially Met    Target Date 08/14/20      PT LONG TERM GOAL #5   Title Pt will report ability to provide patient care and assist with mobility w/o limitation due to pain or muscle fatigue in neck and upper back    Status On-going    Target Date 08/14/20                 Plan - 07/20/20 0828    Clinical Impression Statement Carol Miller noting benefit from mechanical traction last visit with relief of some of pain radiating down under scapula. Increased muscle tension persisting in cervical paraspinals, upper shoulder and periscapular muscles (R>L) - addressed with manual therapy incorporating DN with good twitch responses elicited resulting in palpable reduction in muscle tension. Session concluded with mechanical traction given positive response to initial treatment - force of pull and treatment time slightly progressed with good pt tolerance.    Comorbidities asthma, mammory hypertrophy, obesity    Examination-Activity Limitations Other;Caring for Others    Examination-Participation Restrictions Other    Rehab Potential Good    PT Frequency 2x / week    PT Duration 4 weeks    PT Treatment/Interventions Iontophoresis 70m/ml Dexamethasone;Taping;Patient/family education;Functional mobility training;Moist Heat;Traction;Ultrasound;Passive range of motion;Therapeutic exercise;Dry needling;Spinal Manipulations;Manual techniques;Electrical Stimulation;Cryotherapy;Neuromuscular re-education;ADLs/Self Care Home Management    PT Next Visit Plan MD progress note +/- home cervical traction request for appt 07/27/20; thoracic and cervical mobilization, upper back/scapular/shoulder strengthening - HEP update as indicated, manual therapy with DN for cervicothoracic paraspinals & L shoulder as indicated, cervical traction;  modalities PRN    PT Home Exercise Plan 6/30 - UT & LS stretrches (already doing), rhomboid stretch, red TB scap retraction + low, mid & W rows; 7/7 - FR thoracic rolling and mobilization, snow angel stretch, red TB scap retraction + horiz ABD, ER & UE diagonals over FR    Consulted and Agree with Plan of Care Patient           Patient will benefit from skilled therapeutic intervention in order to improve the following deficits and impairments:  Decreased  range of motion, Obesity, Increased muscle spasms, Pain, Hypomobility, Decreased strength  Visit Diagnosis: Pain in thoracic spine  Cervicalgia  Abnormal posture  Symptomatic mammary hypertrophy     Problem List Patient Active Problem List   Diagnosis Date Noted  . Ureteral calculus, right 12/01/2011    Percival Spanish, PT, MPT 07/20/2020, 9:05 AM  [facility] [address]  [place], [place], [address] Phone: [phone]   Fax:  [phone]  Name: Carol Miller MRN: [id number] Date of Birth: 05/03/1981

## 2020-07-24 ENCOUNTER — Ambulatory Visit: Payer: 59 | Admitting: Physical Therapy

## 2020-07-24 ENCOUNTER — Encounter: Payer: Self-pay | Admitting: Physical Therapy

## 2020-07-24 ENCOUNTER — Other Ambulatory Visit: Payer: Self-pay

## 2020-07-24 DIAGNOSIS — M546 Pain in thoracic spine: Secondary | ICD-10-CM

## 2020-07-24 DIAGNOSIS — R293 Abnormal posture: Secondary | ICD-10-CM

## 2020-07-24 DIAGNOSIS — N62 Hypertrophy of breast: Secondary | ICD-10-CM

## 2020-07-24 DIAGNOSIS — M542 Cervicalgia: Secondary | ICD-10-CM

## 2020-07-24 NOTE — Therapy (Signed)
[facility] [place] [address]  [address] [place], [place], [address] Phone: [phone]   Fax:  [phone]  Physical Therapy Treatment / Progress Note  Patient Details  Name: Carol Miller MRN: [id number] Date of Birth: 01/16/1981 Referring Provider (PT): James Kramer, MD & Kyle Cabbell, MD   Encounter Date: 07/24/2020   PT End of Session - 07/24/20 0806    Visit Number 10    Number of Visits 16    Date for PT Re-Evaluation 08/14/20    Authorization Type UHC    PT Start Time 0806    PT Stop Time 0858    PT Time Calculation (min) 52 min    Activity Tolerance Patient tolerated treatment well    Behavior During Therapy WFL for tasks assessed/performed           Past Medical History:  Diagnosis Date  . Asthma   . Frequency of urination   . Hematuria   . History of kidney stones   . History of seizures as a child    NONE SINCE PER PT  . Nephrolithiasis   . Right ureteral stone   . Urgency of urination     Past Surgical History:  Procedure Laterality Date  . CERVICAL CERCLAGE  10/29/2009  . COLONOSCOPY  2016  . CYSTOSCOPY/URETEROSCOPY/HOLMIUM LASER/STENT PLACEMENT Right 06/10/2016   Procedure: CYSTOSCOPY/URETEROSCOPY/HOLMIUM LASER/STENT PLACEMENT;  Surgeon: Benjamin W Herrick, MD;  Location: [facility];  Service: Urology;  Laterality: Right;  . EXTRACORPOREAL SHOCK WAVE LITHOTRIPSY Right 12/01/2011  . LAPAROSCOPIC TUBAL LIGATION Bilateral 05/14/2010   w/ fulgeration  . TONSILLECTOMY  as child  . TUBAL LIGATION      There were no vitals filed for this visit.   Subjective Assessment - 07/24/20 0809    Subjective Pt reports her pain has been much better all week. Still noting some discomfort under the L shoulder blade but it is now more intermittent than constant like it used to be.    Currently in Pain? Yes    Pain Score 2     Pain Location Scapula    Pain Orientation Left    Pain Descriptors /  Indicators Discomfort    Pain Type Chronic pain    Pain Frequency Intermittent              [facility] PT Assessment - 07/24/20 0806      Assessment   Medical Diagnosis Cervical myofascial pain syndrome    Referring Provider (PT) James Kramer, MD & Kyle Cabbell, MD    Onset Date/Surgical Date --   many yrs ago   Hand Dominance Right    Next MD Visit 07/27/20 -  Dr. Cabbell      AROM   Cervical Flexion 46 - minimal tightness in R upper shoulder but no pain    Cervical Extension WNL - 75    Cervical - Right Side Bend WNL - 45    Cervical - Left Side Bend WNL - 44    Cervical - Right Rotation WFL - 68    Cervical - Left Rotation WFL - 74    Thoracic Flexion WNL    Thoracic Extension WNL    Thoracic - Right Rotation WNL    Thoracic - Left Rotation WNL      Strength   Right Shoulder Flexion 4+/5    Right Shoulder Extension 5/5    Right Shoulder ABduction 4+/5    Right Shoulder Internal Rotation 5/5      Right Shoulder External Rotation 5/5    Right Shoulder Horizontal ABduction 4+/5    Right Shoulder Horizontal ADduction 5/5    Left Shoulder Flexion 4/5   pain at shoulder   Left Shoulder Extension 5/5    Left Shoulder ABduction 4-/5   pain at shoulder   Left Shoulder Internal Rotation 4+/5    Left Shoulder External Rotation 4+/5    Left Shoulder Horizontal ABduction --   4+/5   Left Shoulder Horizontal ADduction 4+/5                         [facility] Adult PT Treatment/Exercise - 07/24/20 0806      Neck Exercises: Standing   Wall Push Ups 10 reps      Neck Exercises: Seated   X to V 10 reps;Weight    X to V Weights (lbs) 1    Shoulder Flexion Both;10 reps;Weights    Shoulder Flexion Weights (lbs) 1    Shoulder ABduction Both;10 reps;Weights    Shoulder Abduction Weights (lbs) 1      Neck Exercises: Prone   Neck Retraction 10 reps;5 secs    Neck Retraction Limitations POE    Other Prone Exercise POE scapular pushup x 10      Shoulder Exercises:  ROM/Strengthening   UBE (Upper Arm Bike) L2.5 x 6' (3' fwd/3' back)      Traction   Type of Traction Cervical    Min (lbs) 16    Max (lbs) 24    Hold Time 60    Rest Time 20    Time 12                       PT Long Term Goals - 07/24/20 0812      PT LONG TERM GOAL #1   Title I with HEP for upper back pain relief    Status Partially Met   07/24/20 - met for current HEP   Target Date 08/14/20      PT LONG TERM GOAL #2   Title Pt will demo painfree and full thoracic rotation    Status Achieved   06/03/20     PT LONG TERM GOAL #3   Title Pt will report =/> 70% reduction of mid back pain after a work shift    Baseline report =/> 50% reduction of mid back pain after a work shift - met as of 07/14/20    Status Achieved   07/24/20 - notes overall 80% reduction in pain and feels optomistic that she will get to 100%     PT LONG TERM GOAL #4   Title improve Lt shoulder strength =/> 4+/5 to assist her at work.    Status Partially Met    Target Date 08/14/20      PT LONG TERM GOAL #5   Title Pt will report ability to provide patient care and assist with mobility w/o limitation due to pain or muscle fatigue in neck and upper back    Status Unable to assess   07/24/20 - currently on restriction per MD   Target Date 08/14/20                 Plan - 07/24/20 0827    Clinical Impression Statement Trenyce reports 80% improvement in pain thus far with PT, noting pain now more intermittent than constant, and feels confident that she will be able to resolve her pain. Her cervical and   thoracic AROM is now WFL/WNL without pain, only noting some pulling/tightness, and her posture is improving. Shoulder strength is also improving with greatest remaining weakness in L shoulder flexion and abduction with pain noted on MMT resistance for these motions. All LTGs now met or partially met with exception of inability to assess how she is doing with patient care as she is still on restricted  activity per MD but she I she is hopeful that MD will decrease/remove her restrictions next week and does not anticipate any issues with resuming normal patient care. She has noted benefit with cervical traction over past few visits and is interested in a home cervical traction unit, therefore provided her with referral paperwork for MD prescription and letter of medical necessity to take with her to her MD appointment on Monday 07/27/20. She has 6 visits remaining in her current POC and will benefit from continued skilled PT to address remaining pain and strength deficits.    Comorbidities asthma, mammory hypertrophy, obesity    Examination-Activity Limitations Other;Caring for Others    Examination-Participation Restrictions Other    Rehab Potential Good    PT Frequency 2x / week    PT Duration 4 weeks    PT Treatment/Interventions Iontophoresis 4mg/ml Dexamethasone;Taping;Patient/family education;Functional mobility training;Moist Heat;Traction;Ultrasound;Passive range of motion;Therapeutic exercise;Dry needling;Spinal Manipulations;Manual techniques;Electrical Stimulation;Cryotherapy;Neuromuscular re-education;ADLs/Self Care Home Management    PT Next Visit Plan thoracic and cervical mobilization, upper back/scapular/shoulder strengthening - HEP update as indicated, manual therapy with DN for cervicothoracic paraspinals & L shoulder as indicated, cervical traction; modalities PRN    PT Home Exercise Plan 6/30 - UT & LS stretrches (already doing), rhomboid stretch, red TB scap retraction + low, mid & W rows; 7/7 - FR thoracic rolling and mobilization, snow angel stretch, red TB scap retraction + horiz ABD, ER & UE diagonals over FR    Consulted and Agree with Plan of Care Patient           Patient will benefit from skilled therapeutic intervention in order to improve the following deficits and impairments:  Decreased range of motion, Obesity, Increased muscle spasms, Pain, Hypomobility, Decreased  strength  Visit Diagnosis: Pain in thoracic spine  Cervicalgia  Abnormal posture  Symptomatic mammary hypertrophy     Problem List Patient Active Problem List   Diagnosis Date Noted  . Ureteral calculus, right 12/01/2011    JoAnne M Kreis, PT, MPT 07/24/2020, 12:48 PM  [facility] [place] [address]  [address] [place], [place], [address] Phone: [phone]   Fax:  [phone]  Name: Carol Miller MRN: [id number] Date of Birth: 10/25/1981   

## 2020-07-28 ENCOUNTER — Ambulatory Visit: Payer: 59 | Admitting: Physical Therapy

## 2020-07-28 ENCOUNTER — Other Ambulatory Visit: Payer: Self-pay

## 2020-07-28 DIAGNOSIS — N62 Hypertrophy of breast: Secondary | ICD-10-CM

## 2020-07-28 DIAGNOSIS — R293 Abnormal posture: Secondary | ICD-10-CM

## 2020-07-28 DIAGNOSIS — M542 Cervicalgia: Secondary | ICD-10-CM

## 2020-07-28 DIAGNOSIS — M546 Pain in thoracic spine: Secondary | ICD-10-CM | POA: Diagnosis not present

## 2020-07-28 NOTE — Therapy (Signed)
[facility] [place] [address]  [address] [place], [place], [address] Phone: [phone]   Fax:  [phone]  Physical Therapy Treatment  Patient Details  Name: Carol Miller MRN: [id number] Date of Birth: 07/04/1981 Referring Provider (PT): James Kramer, MD & Kyle Cabbell, MD   Encounter Date: 07/28/2020   PT End of Session - 07/28/20 0804    Visit Number 11    Number of Visits 16    Date for PT Re-Evaluation 08/14/20    Authorization Type UHC    PT Start Time 0804    PT Stop Time 0901    PT Time Calculation (min) 57 min    Activity Tolerance Patient tolerated treatment well    Behavior During Therapy WFL for tasks assessed/performed           Past Medical History:  Diagnosis Date  . Asthma   . Frequency of urination   . Hematuria   . History of kidney stones   . History of seizures as a child    NONE SINCE PER PT  . Nephrolithiasis   . Right ureteral stone   . Urgency of urination     Past Surgical History:  Procedure Laterality Date  . CERVICAL CERCLAGE  10/29/2009  . COLONOSCOPY  2016  . CYSTOSCOPY/URETEROSCOPY/HOLMIUM LASER/STENT PLACEMENT Right 06/10/2016   Procedure: CYSTOSCOPY/URETEROSCOPY/HOLMIUM LASER/STENT PLACEMENT;  Surgeon: Benjamin W Herrick, MD;  Location: [facility];  Service: Urology;  Laterality: Right;  . EXTRACORPOREAL SHOCK WAVE LITHOTRIPSY Right 12/01/2011  . LAPAROSCOPIC TUBAL LIGATION Bilateral 05/14/2010   w/ fulgeration  . TONSILLECTOMY  as child  . TUBAL LIGATION      There were no vitals filed for this visit.   Subjective Assessment - 07/28/20 0806    Subjective Pt reports she is going more time w/o pain than with pain at this point and feels that she is better able to manage the pain when it does occur. States she was released by MD to return to full duty - her next work shift is Thursday.    Pain Score 1     Pain Location Scapula    Pain Orientation Right     Pain Descriptors / Indicators Aching   "more just noticeable than painful"   Pain Type Chronic pain    Pain Frequency Intermittent              [facility] PT Assessment - 07/28/20 0804      Assessment   Medical Diagnosis Cervical myofascial pain syndrome    Referring Provider (PT) James Kramer, MD & Kyle Cabbell, MD    Onset Date/Surgical Date --   many yrs ago   Next MD Visit PRN                         [facility] Adult PT Treatment/Exercise - 07/28/20 0804      Neck Exercises: Machines for Strengthening   Cybex Row 20# - low row x 15    Lat Pull 15# x 15      Neck Exercises: Theraband   Other Theraband Exercises --      Neck Exercises: Standing   Upper Extremity D1 Flexion;Extension;10 reps;Theraband    Theraband Level (UE D1) Level 1 (Yellow)    Upper Extremity D2 Flexion;Extension;10 reps;Theraband    Theraband Level (UE D2) Level 1 (Yellow)      Neck Exercises: Prone   W Back Limitations W' + thoracic   extension over green Pball    Shoulder Extension 10 reps    Shoulder Extension Limitations I's + thoracic extension over green Pball    Other Prone Exercise T's & Y's + thoracic extension over green Pball x 10 each      Shoulder Exercises: ROM/Strengthening   UBE (Upper Arm Bike) L2.5 x 6' (3' fwd/3' back)    "W" Arms Red TB "W" row 10 x 5"      Traction   Type of Traction Cervical   20 dg pull   Min (lbs) 18    Max (lbs) 26    Hold Time 60    Rest Time 20    Time 12                  PT Education - 07/28/20 0844    Education Details HEP update - prone scapular retraction (I,T,Y & W) + thoracic extension over green Pball    Person(s) Educated Patient    Methods Explanation;Demonstration;Verbal cues;Handout    Comprehension Verbalized understanding;Verbal cues required;Returned demonstration;Need further instruction               PT Long Term Goals - 07/24/20 1694      PT LONG TERM GOAL #1   Title I with HEP for upper back pain relief     Status Partially Met   07/24/20 - met for current HEP   Target Date 08/14/20      PT LONG TERM GOAL #2   Title Pt will demo painfree and full thoracic rotation    Status Achieved   06/03/20     PT LONG TERM GOAL #3   Title Pt will report =/> 70% reduction of mid back pain after a work shift    Baseline report =/> 50% reduction of mid back pain after a work shift - met as of 07/14/20    Status Achieved   07/24/20 - notes overall 80% reduction in pain and feels optomistic that she will get to 100%     PT LONG TERM GOAL #4   Title improve Lt shoulder strength =/> 4+/5 to assist her at work.    Status Partially Met    Target Date 08/14/20      PT LONG TERM GOAL #5   Title Pt will report ability to provide patient care and assist with mobility w/o limitation due to pain or muscle fatigue in neck and upper back    Status Unable to assess   07/24/20 - currently on restriction per MD   Target Date 08/14/20                 Plan - 07/28/20 0809    Clinical Impression Statement Carol Miller reports her pain is being well managed with exercises, DN, estim and cervical traction. Her f/u visit with the Dr. Christella Noa on Monday went well and the MD has released her to return to work at full duty with no need to return to MD unless she feels things are not continuing to improve. Continued strengthening progression today focusing on thoracic extension, scapular stabilization and shoulder strength with good tolerance - HEP updated to include prone thoracic extension + scapular retraction (I,T,Y & W) over physioball as pt noting benefit from these. Session concluded with mechanical cervical traction as pt noting continued benefit and pain relief with this - MD approving home cervical traction and estim unit for ongoing pain management.    Comorbidities asthma, mammory hypertrophy, obesity    Examination-Activity  Limitations Other;Caring for Others    Examination-Participation Restrictions Other    Rehab  Potential Good    PT Frequency 2x / week    PT Duration 4 weeks    PT Treatment/Interventions Iontophoresis 4mg/ml Dexamethasone;Taping;Patient/family education;Functional mobility training;Moist Heat;Traction;Ultrasound;Passive range of motion;Therapeutic exercise;Dry needling;Spinal Manipulations;Manual techniques;Electrical Stimulation;Cryotherapy;Neuromuscular re-education;ADLs/Self Care Home Management    PT Next Visit Plan thoracic and cervical mobilization, upper back/scapular/shoulder strengthening - HEP update as indicated, manual therapy with DN for cervicothoracic paraspinals & L shoulder as indicated, cervical traction; modalities PRN    PT Home Exercise Plan 6/30 - UT & LS stretrches (already doing), rhomboid stretch, red TB scap retraction + low, mid & W rows; 7/7 - FR thoracic rolling and mobilization, snow angel stretch, red TB scap retraction + horiz ABD, ER & UE diagonals over FR; 7/28 - progression of hooklying scapular strengthening to upright position using FR against wall or doorframe for tactile feedback; 9/21 - prone scapular retraction (I,T,Y & W) + thoracic extension over green Pball    Consulted and Agree with Plan of Care Patient           Patient will benefit from skilled therapeutic intervention in order to improve the following deficits and impairments:  Decreased range of motion, Obesity, Increased muscle spasms, Pain, Hypomobility, Decreased strength  Visit Diagnosis: Pain in thoracic spine  Cervicalgia  Abnormal posture  Symptomatic mammary hypertrophy     Problem List Patient Active Problem List   Diagnosis Date Noted  . Ureteral calculus, right 12/01/2011    JoAnne M Kreis, PT, MPT 07/28/2020, 12:02 PM  [facility] [place] [address]  [address] [place], [place], [address] Phone: [phone]   Fax:  [phone]  Name: Carol Miller MRN: [id number] Date of Birth: 08/29/1981   

## 2020-07-28 NOTE — Patient Instructions (Signed)
    Home exercise program created by Rilynn Habel, PT.  For questions, please contact Fayne Mcguffee via phone at [phone] or email at [email or web address]  [facility] [place] [address]  [address] [place], [place], [address] Phone: [phone]   Fax:  [phone]    

## 2020-07-30 ENCOUNTER — Ambulatory Visit: Payer: 59

## 2020-08-03 ENCOUNTER — Ambulatory Visit: Payer: 59 | Admitting: Physical Therapy

## 2020-08-07 ENCOUNTER — Ambulatory Visit: Payer: 59

## 2020-08-10 ENCOUNTER — Encounter: Payer: 59 | Admitting: Physical Therapy

## 2020-08-11 ENCOUNTER — Encounter: Payer: Self-pay | Admitting: Physical Therapy

## 2020-08-11 ENCOUNTER — Other Ambulatory Visit: Payer: Self-pay

## 2020-08-11 ENCOUNTER — Ambulatory Visit: Payer: 59 | Attending: Neurosurgery | Admitting: Physical Therapy

## 2020-08-11 DIAGNOSIS — M542 Cervicalgia: Secondary | ICD-10-CM | POA: Insufficient documentation

## 2020-08-11 DIAGNOSIS — R293 Abnormal posture: Secondary | ICD-10-CM | POA: Insufficient documentation

## 2020-08-11 DIAGNOSIS — M546 Pain in thoracic spine: Secondary | ICD-10-CM | POA: Diagnosis present

## 2020-08-11 DIAGNOSIS — N62 Hypertrophy of breast: Secondary | ICD-10-CM | POA: Insufficient documentation

## 2020-08-11 NOTE — Therapy (Addendum)
[facility] [address]  [place], [place], [address] Phone: [phone]   Fax:  [phone]  Physical Therapy Treatment / Discharge Summary  Patient Details  Name: Carol Miller MRN: [id number] Date of Birth: 04-11-1981 Referring Provider (PT): Berle Mull, MD & Ashok Pall, MD   Encounter Date: 08/11/2020   PT End of Session - 08/11/20 1711    Visit Number 12    Number of Visits 16    Date for PT Re-Evaluation 08/14/20    Authorization Type UHC    PT Start Time 4982   Pt arrived late   PT Stop Time 1747    PT Time Calculation (min) 36 min    Activity Tolerance Patient tolerated treatment well    Behavior During Therapy [facility] for tasks assessed/performed           Past Medical History:  Diagnosis Date  . Asthma   . Frequency of urination   . Hematuria   . History of kidney stones   . History of seizures as a child    NONE SINCE PER PT  . Nephrolithiasis   . Right ureteral stone   . Urgency of urination     Past Surgical History:  Procedure Laterality Date  . CERVICAL CERCLAGE  10/29/2009  . COLONOSCOPY  2016  . CYSTOSCOPY/URETEROSCOPY/HOLMIUM LASER/STENT PLACEMENT Right 06/10/2016   Procedure: CYSTOSCOPY/URETEROSCOPY/HOLMIUM LASER/STENT PLACEMENT;  Surgeon: Ardis Hughs, MD;  Location: [facility];  Service: Urology;  Laterality: Right;  . EXTRACORPOREAL SHOCK WAVE LITHOTRIPSY Right 12/01/2011  . LAPAROSCOPIC TUBAL LIGATION Bilateral 05/14/2010   w/ fulgeration  . TONSILLECTOMY  as child  . TUBAL LIGATION      There were no vitals filed for this visit.   Subjective Assessment - 08/11/20 1713    Subjective Pt reports things have been going well even now that she is back to full-duty at work. States she tried not taking her Celebrex today and seems to be doing ok. Has been using home TENS unit and cervical traction at home with good relief. Feels like she will be ready to  transition to HEP at end of current POC this week.    Currently in Pain? Yes    Pain Score 2     Pain Location Scapula    Pain Orientation Right    Pain Descriptors / Indicators Aching                             [facility] Adult PT Treatment/Exercise - 08/11/20 1711      Neck Exercises: Machines for Strengthening   Cybex Row 20# - low row x 15    Lat Pull 15# x 15      Neck Exercises: Prone   W Back 10 reps    W Back Weights (lbs) 2    W Back Limitations W' + thoracic extension over green Pball    Shoulder Extension 10 reps    Shoulder Extension Weights (lbs) 2    Shoulder Extension Limitations I's + thoracic extension over green Pball    Other Prone Exercise T's & Y's + thoracic extension over green Pball 1# x 10 each      Shoulder Exercises: Standing   Horizontal ABduction Both;10 reps;Strengthening;Theraband    Theraband Level (Shoulder Horizontal ABduction) Level 3 (Green)    Horizontal ABduction Limitations standing with spine against 6" FR to promote increased scap retraction  External Rotation Both;10 reps;Strengthening;Theraband    Theraband Level (Shoulder External Rotation) Level 3 (Green)    External Rotation Limitations standing with spine against 6" FR to promote increased scap retraction    Row Both;10 reps;Strengthening;Theraband    Theraband Level (Shoulder Row) Level 4 (Blue)    Retraction Both;10 reps;Strengthening;Theraband    Theraband Level (Shoulder Retraction) Level 4 (Blue)    Retraction Limitations + mini-shoulder extension (low row)    Diagonals Both;10 reps;Strengthening;Theraband    Theraband Level (Shoulder Diagonals) Level 3 (Green)    Diagonals Limitations standing with spine against 6" FR to promote increased scap retraction      Shoulder Exercises: ROM/Strengthening   UBE (Upper Arm Bike) L2.5 x 6' (3' fwd/3' back)    "W" Arms Green TB "W" row 10 x 5"                  PT Education - 08/11/20 1747    Education  Details Final HEP review/update    Person(s) Educated Patient    Methods Explanation;Demonstration    Comprehension Verbalized understanding;Returned demonstration               PT Long Term Goals - 07/24/20 0812      PT LONG TERM GOAL #1   Title I with HEP for upper back pain relief    Status Partially Met   07/24/20 - met for current HEP   Target Date 08/14/20      PT LONG TERM GOAL #2   Title Pt will demo painfree and full thoracic rotation    Status Achieved   06/03/20     PT LONG TERM GOAL #3   Title Pt will report =/> 70% reduction of mid back pain after a work shift    Baseline report =/> 50% reduction of mid back pain after a work shift - met as of 07/14/20    Status Achieved   07/24/20 - notes overall 80% reduction in pain and feels optomistic that she will get to 100%     PT LONG TERM GOAL #4   Title improve Lt shoulder strength =/> 4+/5 to assist her at work.    Status Partially Met    Target Date 08/14/20      PT LONG TERM GOAL #5   Title Pt will report ability to provide patient care and assist with mobility w/o limitation due to pain or muscle fatigue in neck and upper back    Status Unable to assess   07/24/20 - currently on restriction per MD   Target Date 08/14/20                 Plan - 08/11/20 1815    Clinical Impression Statement Carol Miller returning after 2-week absence reporting her pain continues to be well controlled with exercises, home TENS unit and cervical traction, even with return to full duty at work. She feels that she will be ready to transition to her HEP after her final visit at the end of this week, therefore completed a comprehensive review of the HEP exercises providing guidance on continued self-progression of exercises with addition to small Mostek weights, progression of theraband resistance and/or increasing resp/sets - pt able to provide good return demonstration of all exercises. Pt also requesting review of machine strengthening,  therefore pointed out proper machine set-up and technique for machine rows and lat pulls. Emerly reporting no issues following HEP review today and agrees with plan for transition to HEP upon final goal  assessment next visit.    Comorbidities asthma, mammory hypertrophy, obesity    Rehab Potential Good    PT Frequency 2x / week    PT Duration 4 weeks    PT Treatment/Interventions Iontophoresis 56m/ml Dexamethasone;Taping;Patient/family education;Functional mobility training;Moist Heat;Traction;Ultrasound;Passive range of motion;Therapeutic exercise;Dry needling;Spinal Manipulations;Manual techniques;Electrical Stimulation;Cryotherapy;Neuromuscular re-education;ADLs/Self Care Home Management    PT Next Visit Plan discharge goal assessment - transition to HEP    PT Home Exercise Plan 6/30 - UT & LS stretrches (already doing), rhomboid stretch, red TB scap retraction + low, mid & W rows; 7/7 - FR thoracic rolling and mobilization, snow angel stretch, red TB scap retraction + horiz ABD, ER & UE diagonals over FR; 7/28 - progression of hooklying scapular strengthening to upright position using FR against wall or doorframe for tactile feedback; 9/21 - prone scapular retraction (I,T,Y & W) + thoracic extension over green Pball    Consulted and Agree with Plan of Care Patient           Patient will benefit from skilled therapeutic intervention in order to improve the following deficits and impairments:     Visit Diagnosis: Pain in thoracic spine  Cervicalgia  Abnormal posture  Symptomatic mammary hypertrophy     Problem List Patient Active Problem List   Diagnosis Date Noted  . Ureteral calculus, right 12/01/2011    JPercival Spanish PT, MPT 08/11/2020, 6:17 PM  [facility] [place] [address]Phone: [phone]  Fax:  [phone] Name: CYezenia FredrickFree MRN: [id number] of Birth:  8December 13, 1982  PHYSICAL THERAPY DISCHARGE SUMMARY  Visits from Start of Care: 12  Current functional level related to goals / functional outcomes:   Refer to above clinical impression for status as of last visit on 08/11/2020. Patient cancelled her final vist and was placed on hold for 30 days and has not needed to return to PT, therefore will proceed with discharge from PT for this episode.   Remaining deficits:   As above. Unable to formally assess secondary to failure to return for planned final visit.   Education / Equipment:   HEP  Plan: Patient agrees to discharge.  Patient goals were partially met. Patient is being discharged due to being pleased with the current functional level.  ?????     JPercival Spanish PT, MPT 10/09/20, 8:32 AM  [facility]2[place] [address]Phone: [phone]  Fax:  [phone]

## 2020-08-14 ENCOUNTER — Ambulatory Visit: Payer: 59 | Admitting: Physical Therapy

## 2021-01-26 ENCOUNTER — Encounter (HOSPITAL_BASED_OUTPATIENT_CLINIC_OR_DEPARTMENT_OTHER): Payer: Self-pay | Admitting: *Deleted

## 2021-01-26 ENCOUNTER — Emergency Department (HOSPITAL_BASED_OUTPATIENT_CLINIC_OR_DEPARTMENT_OTHER): Payer: No Typology Code available for payment source

## 2021-01-26 ENCOUNTER — Other Ambulatory Visit: Payer: Self-pay

## 2021-01-26 ENCOUNTER — Emergency Department (HOSPITAL_BASED_OUTPATIENT_CLINIC_OR_DEPARTMENT_OTHER)
Admission: EM | Admit: 2021-01-26 | Discharge: 2021-01-26 | Disposition: A | Discharge status: Home / Self Care | Payer: No Typology Code available for payment source | Attending: Emergency Medicine | Admitting: Emergency Medicine

## 2021-01-26 DIAGNOSIS — N201 Calculus of ureter: Secondary | ICD-10-CM | POA: Insufficient documentation

## 2021-01-26 DIAGNOSIS — Z91040 Latex allergy status: Secondary | ICD-10-CM | POA: Diagnosis not present

## 2021-01-26 DIAGNOSIS — Z87891 Personal history of nicotine dependence: Secondary | ICD-10-CM | POA: Diagnosis not present

## 2021-01-26 DIAGNOSIS — N200 Calculus of kidney: Secondary | ICD-10-CM | POA: Diagnosis not present

## 2021-01-26 DIAGNOSIS — J45909 Unspecified asthma, uncomplicated: Secondary | ICD-10-CM | POA: Diagnosis not present

## 2021-01-26 DIAGNOSIS — R1031 Right lower quadrant pain: Secondary | ICD-10-CM | POA: Diagnosis present

## 2021-01-26 LAB — CBC WITH DIFFERENTIAL/PLATELET
Abs Immature Granulocytes: 0.03 10*3/uL (ref 0.00–0.07)
Basophils Absolute: 0.1 10*3/uL (ref 0.0–0.1)
Basophils Relative: 1 %
Eosinophils Absolute: 0 10*3/uL (ref 0.0–0.5)
Eosinophils Relative: 0 %
HCT: 40.2 % (ref 36.0–46.0)
Hemoglobin: 12.6 g/dL (ref 12.0–15.0)
Immature Granulocytes: 0 %
Lymphocytes Relative: 15 %
Lymphs Abs: 1.3 10*3/uL (ref 0.7–4.0)
MCH: 28.1 pg (ref 26.0–34.0)
MCHC: 31.3 g/dL (ref 30.0–36.0)
MCV: 89.7 fL (ref 80.0–100.0)
Monocytes Absolute: 0.4 10*3/uL (ref 0.1–1.0)
Monocytes Relative: 5 %
Neutro Abs: 7.3 10*3/uL (ref 1.7–7.7)
Neutrophils Relative %: 79 %
Platelets: 295 10*3/uL (ref 150–400)
RBC: 4.48 MIL/uL (ref 3.87–5.11)
RDW: 14.2 % (ref 11.5–15.5)
WBC: 9.1 10*3/uL (ref 4.0–10.5)
nRBC: 0 % (ref 0.0–0.2)

## 2021-01-26 LAB — HCG, SERUM, QUALITATIVE: Preg, Serum: NEGATIVE

## 2021-01-26 LAB — COMPREHENSIVE METABOLIC PANEL
ALT: 9 U/L (ref 0–44)
AST: 14 U/L — ABNORMAL LOW (ref 15–41)
Albumin: 4 g/dL (ref 3.5–5.0)
Alkaline Phosphatase: 73 U/L (ref 38–126)
Anion gap: 8 (ref 5–15)
BUN: 9 mg/dL (ref 6–20)
CO2: 25 mmol/L (ref 22–32)
Calcium: 9.1 mg/dL (ref 8.9–10.3)
Chloride: 104 mmol/L (ref 98–111)
Creatinine, Ser: 0.67 mg/dL (ref 0.44–1.00)
GFR, Estimated: >60 mL/min (ref >60)
Glucose, Bld: 131 mg/dL — ABNORMAL HIGH (ref 70–99)
Potassium: 4 mmol/L (ref 3.5–5.1)
Sodium: 137 mmol/L (ref 135–145)
Total Bilirubin: 0.4 mg/dL (ref 0.3–1.2)
Total Protein: 7.7 g/dL (ref 6.5–8.1)

## 2021-01-26 LAB — URINALYSIS, ROUTINE W REFLEX MICROSCOPIC
Bilirubin Urine: NEGATIVE
Glucose, UA: NEGATIVE mg/dL
Ketones, ur: NEGATIVE mg/dL
Leukocytes,Ua: NEGATIVE
Nitrite: NEGATIVE
Protein, ur: NEGATIVE mg/dL
pH: 7.5 (ref 5.0–8.0)

## 2021-01-26 LAB — LIPASE, BLOOD: Lipase: <10 U/L — ABNORMAL LOW (ref 11–51)

## 2021-01-26 LAB — PREGNANCY, URINE: Preg Test, Ur: NEGATIVE

## 2021-01-26 MED ORDER — KETOROLAC TROMETHAMINE 30 MG/ML IJ SOLN
30.0000 mg | Freq: Once | INTRAMUSCULAR | Status: AC
  Administered 2021-01-26: 30 mg via INTRAVENOUS
  Filled 2021-01-26: qty 1

## 2021-01-26 MED ORDER — IOHEXOL 300 MG/ML  SOLN
100.0000 mL | Freq: Once | INTRAMUSCULAR | Status: AC | PRN
  Administered 2021-01-26: 100 mL via INTRAVENOUS

## 2021-01-26 MED ORDER — OXYCODONE HCL 5 MG PO TABS: 5.0000 mg | ORAL_TABLET | ORAL | 0 refills | Status: AC | PRN

## 2021-01-26 MED ORDER — FENTANYL CITRATE (PF) 100 MCG/2ML IJ SOLN
50.0000 ug | Freq: Once | INTRAMUSCULAR | Status: AC
  Administered 2021-01-26: 50 ug via INTRAVENOUS
  Filled 2021-01-26: qty 2

## 2021-01-26 MED ORDER — SODIUM CHLORIDE 0.9 % IV BOLUS
1000.0000 mL | Freq: Once | INTRAVENOUS | Status: AC
  Administered 2021-01-26: 1000 mL via INTRAVENOUS

## 2021-01-26 MED ORDER — TAMSULOSIN HCL 0.4 MG PO CAPS: 0.4000 mg | ORAL_CAPSULE | Freq: Every day | ORAL | 0 refills | Status: AC

## 2021-01-26 MED ORDER — OXYCODONE HCL 5 MG PO TABS: 5.0000 mg | ORAL_TABLET | ORAL | 0 refills | Status: DC | PRN

## 2021-01-26 MED ORDER — MORPHINE SULFATE (PF) 4 MG/ML IV SOLN
4.0000 mg | Freq: Once | INTRAVENOUS | Status: AC
  Administered 2021-01-26: 4 mg via INTRAVENOUS
  Filled 2021-01-26: qty 1

## 2021-01-26 MED ORDER — ONDANSETRON HCL 4 MG PO TABS: 4.0000 mg | ORAL_TABLET | Freq: Four times a day (QID) | ORAL | 0 refills | Status: DC

## 2021-01-26 MED ORDER — ONDANSETRON HCL 4 MG PO TABS: 4.0000 mg | ORAL_TABLET | Freq: Four times a day (QID) | ORAL | 0 refills | Status: AC

## 2021-01-26 MED ORDER — ONDANSETRON HCL 4 MG/2ML IJ SOLN
4.0000 mg | Freq: Once | INTRAMUSCULAR | Status: AC
  Administered 2021-01-26: 4 mg via INTRAVENOUS
  Filled 2021-01-26: qty 2

## 2021-01-26 MED ORDER — TAMSULOSIN HCL 0.4 MG PO CAPS: 0.4000 mg | ORAL_CAPSULE | Freq: Every day | ORAL | 0 refills | Status: DC

## 2021-01-26 NOTE — ED Notes (Deleted)
Pt back from CT

## 2021-01-26 NOTE — ED Triage Notes (Signed)
Patient woke up this morning with RLQ pain radiating to her back and right lower leg.  Pain is getting worst.  Nausea and vomiting this morning.

## 2021-01-26 NOTE — ED Provider Notes (Signed)
[facility] EMERGENCY DEPT Provider Note   CSN: [id number] Arrival date & time: 01/26/21  9791     History Chief Complaint  Patient presents with  . Abdominal Pain    Carol Miller is a 40 y.o. female.  The history is provided by the patient.  Abdominal Pain Pain location:  R flank and RLQ Pain quality: aching   Pain radiates to:  Does not radiate Pain severity:  Mild Onset quality:  Gradual Timing:  Constant Progression:  Unchanged Chronicity:  New Context comment:  Hx of stone, paon in RLQ/flnak for last few hours with  n/v Relieved by:  Nothing Worsened by:  Nothing Associated symptoms: nausea and vomiting   Associated symptoms: no chest pain, no chills, no cough, no dysuria, no fever, no hematuria, no shortness of breath and no sore throat   Risk factors: has not had multiple surgeries        Past Medical History:  Diagnosis Date  . Asthma   . Frequency of urination   . Hematuria   . History of kidney stones   . History of seizures as a child    NONE SINCE PER PT  . Nephrolithiasis   . Right ureteral stone   . Urgency of urination     Patient Active Problem List   Diagnosis Date Noted  . Ureteral calculus, right 12/01/2011    Past Surgical History:  Procedure Laterality Date  . CERVICAL CERCLAGE  10/29/2009  . COLONOSCOPY  2016  . CYSTOSCOPY/URETEROSCOPY/HOLMIUM LASER/STENT PLACEMENT Right 06/10/2016   Procedure: CYSTOSCOPY/URETEROSCOPY/HOLMIUM LASER/STENT PLACEMENT;  Surgeon: Crist Fat, MD;  Location: [facility];  Service: Urology;  Laterality: Right;  . EXTRACORPOREAL SHOCK WAVE LITHOTRIPSY Right 12/01/2011  . LAPAROSCOPIC TUBAL LIGATION Bilateral 05/14/2010   w/ fulgeration  . TONSILLECTOMY  as child  . TUBAL LIGATION       OB History    Gravida  3   Para  2   Term      Preterm      AB      Living        SAB      IAB      Ectopic      Multiple      Live Births               History reviewed. No pertinent family history.  Social History   Tobacco Use  . Smoking status: Former Smoker    Years: 15.00    Types: Cigarettes    Quit date: 06/08/2011    Years since quitting: 9.6  . Smokeless tobacco: Never Used  Substance Use Topics  . Alcohol use: No  . Drug use: No    Home Medications Prior to Admission medications   Medication Sig Start Date End Date Taking? Authorizing Provider  ondansetron (ZOFRAN) 4 MG tablet Take 1 tablet (4 mg total) by mouth every 6 (six) hours. 01/26/21  Yes Curatolo, Adam, DO  oxyCODONE (ROXICODONE) 5 MG immediate release tablet Take 1 tablet (5 mg total) by mouth every 4 (four) hours as needed for up to 20 doses for severe pain. 01/26/21  Yes Curatolo, Adam, DO  tamsulosin (FLOMAX) 0.4 MG CAPS capsule Take 1 capsule (0.4 mg total) by mouth daily for 7 days. 01/26/21 02/02/21 Yes Curatolo, Adam, DO  celecoxib (CELEBREX) 100 MG capsule Take 100 mg by mouth 2 (two) times daily.    [provider]  clindamycin (CLEOCIN) 300 MG capsule Take 1  capsule (300 mg total) by mouth 3 (three) times daily. Patient not taking: Reported on 04/29/2020 07/09/18   Ofilia Neas, PA-C  cyclobenzaprine (FLEXERIL) 10 MG tablet Take 10 mg by mouth at bedtime.    [provider]  HYDROcodone-acetaminophen (NORCO/VICODIN) 5-325 MG tablet Take 1-2 tablets by mouth every 6 (six) hours as needed for moderate pain or severe pain. Patient not taking: Reported on 04/29/2020 07/10/18   Arby Barrette, MD  metFORMIN (GLUCOPHAGE) 500 MG tablet Take by mouth 2 (two) times daily with a meal.    [provider]  naproxen (NAPROSYN) 500 MG tablet Take 1 tablet (500 mg total) by mouth 2 (two) times daily. Patient not taking: Reported on 04/29/2020 07/10/18   Arby Barrette, MD  Phentermine HCl (LOMAIRA) 8 MG TABS Take 1 tablet by mouth 3 (three) times daily.    [provider]    Allergies    Penicillins and Latex  Review of Systems    Review of Systems  Constitutional: Negative for chills and fever.  HENT: Negative for ear pain and sore throat.   Eyes: Negative for pain and visual disturbance.  Respiratory: Negative for cough and shortness of breath.   Cardiovascular: Negative for chest pain and palpitations.  Gastrointestinal: Positive for abdominal pain, nausea and vomiting.  Genitourinary: Negative for dysuria and hematuria.  Musculoskeletal: Negative for arthralgias and back pain.  Skin: Negative for color change and rash.  Neurological: Negative for seizures and syncope.  All other systems reviewed and are negative.   Physical Exam Updated Vital Signs BP 138/89 (BP Location: Left Arm)   Pulse 93   Temp 98.2 F (36.8 C) (Oral)   Resp 20   Ht 5\' 7"  (1.702 m)   Wt 108.9 kg   LMP 01/26/2021   SpO2 100%   BMI 37.59 kg/m   Physical Exam Vitals and nursing note reviewed.  Constitutional:      General: She is not in acute distress.    Appearance: She is well-developed. She is ill-appearing.  HENT:     Head: Normocephalic and atraumatic.     Mouth/Throat:     Mouth: Mucous membranes are moist.  Eyes:     Extraocular Movements: Extraocular movements intact.     Conjunctiva/sclera: Conjunctivae normal.  Cardiovascular:     Rate and Rhythm: Normal rate and regular rhythm.     Heart sounds: Normal heart sounds. No murmur heard.   Pulmonary:     Effort: Pulmonary effort is normal. No respiratory distress.     Breath sounds: Normal breath sounds.  Abdominal:     General: Bowel sounds are normal.     Palpations: Abdomen is soft.     Tenderness: There is abdominal tenderness in the right lower quadrant. There is right CVA tenderness and guarding.  Musculoskeletal:     Cervical back: Neck supple.  Skin:    General: Skin is warm and dry.     Capillary Refill: Capillary refill takes less than 2 seconds.  Neurological:     General: No focal deficit present.     Mental Status: She is alert.     ED  Results / Procedures / Treatments   Labs (all labs ordered are listed, but only abnormal results are displayed) Labs Reviewed  COMPREHENSIVE METABOLIC PANEL - Abnormal; Notable for the following components:      Result Value   Glucose, Bld 131 (*)    AST 14 (*)    All other components within normal limits  LIPASE, BLOOD - Abnormal; Notable for the following components:   Lipase <10 (*)    All other components within normal limits  URINALYSIS, ROUTINE W REFLEX MICROSCOPIC - Abnormal; Notable for the following components:   Color, Urine COLORLESS (*)    Hgb urine dipstick TRACE (*)    All other components within normal limits  CBC WITH DIFFERENTIAL/PLATELET  PREGNANCY, URINE  HCG, SERUM, QUALITATIVE    EKG None  Radiology CT ABDOMEN PELVIS W CONTRAST  Result Date: 01/26/2021 CLINICAL DATA:  Right flank pain and right lower quadrant pain EXAM: CT ABDOMEN AND PELVIS WITH CONTRAST TECHNIQUE: Multidetector CT imaging of the abdomen and pelvis was performed using the standard protocol following bolus administration of intravenous contrast. CONTRAST:  OMNIPAQUE IOHEXOL 300 MG/ML  SOLN COMPARISON:  04/30/2016 FINDINGS: Lower chest: No acute abnormality. Hepatobiliary: No focal liver abnormality is seen. No gallstones, gallbladder wall thickening, or biliary dilatation. Pancreas: Unremarkable. No pancreatic ductal dilatation or surrounding inflammatory changes. Spleen: Normal in size without focal abnormality. Adrenals/Urinary Tract: Unremarkable adrenal glands. 4 x 3 mm obstructing stone at the right ureteropelvic junction (series 2, image 45) resulting in moderate right-sided hydronephrosis. Question soft tissue thickening within the proximal right ureter just beyond the level of the right UPJ (series 5, images 67-68). Multiple additional stones within the right renal collecting system measuring up to 6 mm. Small conglomeration of stones within the inferior pole of the left kidney, largest  measuring up to 6 mm. No left ureteral calculi. No left-sided hydronephrosis. Multiple bilateral renal cysts. Urinary bladder appears within normal limits. Multiple pelvic phleboliths. Stomach/Bowel: Stomach is within normal limits. Appendix appears normal (series 2, image 68). No evidence of bowel wall thickening, distention, or inflammatory changes. Vascular/Lymphatic: No significant vascular findings are present. No enlarged abdominal or pelvic lymph nodes. Reproductive: Uterus and bilateral adnexa are unremarkable. Other: No Helton fluid. No abdominopelvic fluid collection. No pneumoperitoneum. No abdominal wall hernia. Musculoskeletal: No acute osseous findings. No suspicious bone lesion. IMPRESSION: 1. Obstructing 4 x 3 mm stone at the right ureteropelvic junction resulting in moderate right-sided hydronephrosis. 2. Question soft tissue thickening within the proximal right ureter just beyond the level of the right UPJ. Findings may be reactive secondary to adjacent obstructing stone. Underlying urothelial neoplasm not excluded. Follow-up multiphasic CT urogram recommended following the resolution of patient's acute symptoms. 3. Additional bilateral nephrolithiasis. Electronically Signed   By: Duanne Guess D.O.   On: 01/26/2021 12:52    Procedures Procedures   Medications Ordered in ED Medications  sodium chloride 0.9 % bolus 1,000 mL (0 mLs Intravenous Stopped 01/26/21 1111)  fentaNYL (SUBLIMAZE) injection 50 mcg (50 mcg Intravenous Given 01/26/21 0958)  ondansetron (ZOFRAN) injection 4 mg (4 mg Intravenous Given 01/26/21 0953)  fentaNYL (SUBLIMAZE) injection 50 mcg (50 mcg Intravenous Given 01/26/21 1042)  sodium chloride 0.9 % bolus 1,000 mL (1,000 mLs Intravenous New Bag/Given 01/26/21 1317)  iohexol (OMNIPAQUE) 300 MG/ML solution 100 mL (100 mLs Intravenous Contrast Given 01/26/21 1207)  morphine 4 MG/ML injection 4 mg (4 mg Intravenous Given 01/26/21 1302)  ketorolac (TORADOL) 30 MG/ML injection  30 mg (30 mg Intravenous Given 01/26/21 1311)    ED Course  I have reviewed the triage vital signs and the nursing notes.  Pertinent labs & imaging results that were available during my care of the patient were reviewed by me and considered in my medical decision making (see chart for details).    MDM Rules/Calculators/A&P  George N Moncayo is here with right-sided abdominal pain.  Normal vitals.  No fever.  History of kidney stones.  Suspect kidney stone versus appendicitis versus ovarian etiology.  Seems to be less likely cholecystitis.  Will check basic labs to rule these out.  We will also get CT scan abdomen and pelvis.  Will give fluid bolus, Zofran, fentanyl and reevaluate.  CT scan shows 4 x 3 mm obstructing stone in the right UPJ.  No fever, no white count, urinalysis negative for infection.  Overall suspect pain secondary to kidney stone.  There was a soft tissue thickening just proximal to the right ureter.  Could be reactive tissue but could be a neoplasm.  She has a urologist that she follows with already.  Recommend that she discuss this finding with urology as she may need multiphasic CT urogram once symptoms have improved.  Narcotic pain medicine, nausea medicine, Flomax sent to her pharmacy.  She is feeling better after IV fluids, IV pain medicine.  She understands return precautions.   This chart was dictated using voice recognition software.  Despite best efforts to proofread,  errors can occur which can change the documentation meaning.   Final Clinical Impression(s) / ED Diagnoses Final diagnoses:  Kidney stone    Rx / DC Orders ED Discharge Orders         Ordered    oxyCODONE (ROXICODONE) 5 MG immediate release tablet  Every 4 hours PRN        01/26/21 1325    ondansetron (ZOFRAN) 4 MG tablet  Every 6 hours        01/26/21 1325    tamsulosin (FLOMAX) 0.4 MG CAPS capsule  Daily        01/26/21 1325           Robinhooduratolo, Madelaine Bhatdam,  DO 01/26/21 1327

## 2021-01-26 NOTE — Discharge Instructions (Signed)
Take medications as prescribed to help with pain for kidney stone.  Please return if you develop fever, uncontrollable pain, nausea and vomiting despite medications at home.  Please follow-up closely with the urologist.  There was a soft tissue area next your kidney stone which could be reactive tissue from your kidney stone but could be a mass.  May need additional imaging to further evaluate this once your symptoms have improved.

## 2021-01-26 NOTE — ED Notes (Addendum)
Pt to CT

## 2021-02-07 ENCOUNTER — Telehealth: Payer: Self-pay | Admitting: Urology

## 2021-02-07 MED ORDER — OXYBUTYNIN CHLORIDE 5 MG PO TABS: 5.0000 mg | ORAL_TABLET | Freq: Three times a day (TID) | ORAL | 0 refills | Status: AC | PRN

## 2021-02-07 NOTE — Telephone Encounter (Signed)
Returned patient call. Patient underwent bilateral USE/LL and bilateral ureteral stents with Dr. Alvester Morin on 02/04/20 at the surgical center. She reports persistent pain since, including what sounds like bladder spasms. She is scheduled to have her stents removed on Tuesday. She finished her course of Vicodin today. She has been taking her celebrex BID and flomax as prescribed.   I discussed that she should take scheduled tylenol 1g q6h until her stents are removed. She should continue her flomax and celebrex plus use heating pads. I sent in a Rx for oxybutynin 5mg  TID PRN for bladder spasms. Side effects discussed. I encouraged her to call back and/or go to ED if symptoms still not controlled with above regimen.

## 2021-08-05 ENCOUNTER — Ambulatory Visit: Payer: No Typology Code available for payment source | Attending: Sports Medicine | Admitting: Physical Therapy

## 2021-08-05 ENCOUNTER — Encounter: Payer: Self-pay | Admitting: Physical Therapy

## 2021-08-05 ENCOUNTER — Other Ambulatory Visit: Payer: Self-pay

## 2021-08-05 DIAGNOSIS — M62838 Other muscle spasm: Secondary | ICD-10-CM | POA: Insufficient documentation

## 2021-08-05 DIAGNOSIS — M546 Pain in thoracic spine: Secondary | ICD-10-CM | POA: Insufficient documentation

## 2021-08-05 DIAGNOSIS — M542 Cervicalgia: Secondary | ICD-10-CM | POA: Diagnosis present

## 2021-08-05 DIAGNOSIS — N62 Hypertrophy of breast: Secondary | ICD-10-CM | POA: Insufficient documentation

## 2021-08-05 DIAGNOSIS — R293 Abnormal posture: Secondary | ICD-10-CM | POA: Insufficient documentation
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
## 2021-08-09 ENCOUNTER — Encounter: Payer: Self-pay | Admitting: Physical Therapy

## 2021-08-09 ENCOUNTER — Ambulatory Visit: Payer: No Typology Code available for payment source | Attending: Sports Medicine | Admitting: Physical Therapy

## 2021-08-09 ENCOUNTER — Other Ambulatory Visit: Payer: Self-pay

## 2021-08-09 DIAGNOSIS — M542 Cervicalgia: Secondary | ICD-10-CM | POA: Diagnosis present

## 2021-08-09 DIAGNOSIS — R293 Abnormal posture: Secondary | ICD-10-CM | POA: Diagnosis present

## 2021-08-09 DIAGNOSIS — M62838 Other muscle spasm: Secondary | ICD-10-CM | POA: Diagnosis present

## 2021-08-09 DIAGNOSIS — M546 Pain in thoracic spine: Secondary | ICD-10-CM | POA: Insufficient documentation

## 2021-08-09 DIAGNOSIS — N62 Hypertrophy of breast: Secondary | ICD-10-CM | POA: Diagnosis present

## 2021-08-09 NOTE — Therapy (Signed)
[facility] [place] [facility] [address] [place], [place], [address] Phone: [phone]   Fax:  [phone]  Physical Therapy Treatment  Patient Details  Name: Carol Miller MRN: [id number] Date of Birth: [birth year] Referring Provider (PT): Pati Gallo   Encounter Date: 08/09/2021   PT End of Session - 08/09/21 1159     Visit Number 2    Date for PT Re-Evaluation 11/04/21    Authorization Type UHC    PT Start Time 1110    PT Stop Time 1145    PT Time Calculation (min) 35 min    Activity Tolerance Patient tolerated treatment well    Behavior During Therapy [facility] for tasks assessed/performed             Past Medical History:  Diagnosis Date   Asthma    Frequency of urination    Hematuria    History of kidney stones    History of seizures as a child    NONE SINCE PER PT   Nephrolithiasis    Right ureteral stone    Urgency of urination     Past Surgical History:  Procedure Laterality Date   CERVICAL CERCLAGE  10/29/2009   COLONOSCOPY  2016   CYSTOSCOPY/URETEROSCOPY/HOLMIUM LASER/STENT PLACEMENT Right 06/10/2016   Procedure: CYSTOSCOPY/URETEROSCOPY/HOLMIUM LASER/STENT PLACEMENT;  Surgeon: Crist Fat, MD;  Location: [facility];  Service: Urology;  Laterality: Right;   EXTRACORPOREAL SHOCK WAVE LITHOTRIPSY Right 12/01/2011   LAPAROSCOPIC TUBAL LIGATION Bilateral 05/14/2010   w/ fulgeration   TONSILLECTOMY  as child   TUBAL LIGATION      There were no vitals filed for this visit.   Subjective Assessment - 08/09/21 1151     Subjective Patient reports improved pain, minimally impedes her activities, tightness in cervical, upper traps, paraspinals, B.    Currently in Pain? Yes    Pain Score 1     Pain Location Neck    Pain Orientation Right;Left;Upper;Mid;Lower    Pain Descriptors / Indicators Aching    Pain Type Chronic pain;Acute pain                               [facility] Adult  PT Treatment/Exercise - 08/09/21 1118       Exercises   Exercises Neck      Neck Exercises: Theraband   Shoulder Extension 20 reps;Green    Rows 20 reps;Green      Neck Exercises: Seated   Cervical Rotation Right;Left;10 reps    Cervical Rotation Limitations cervical snag with towel to tolerance    Other Seated Exercise cervical extension snag 10 times, to tolerance      Manual Therapy   Manual Therapy Soft tissue mobilization    Manual therapy comments Cervical paraspinals, upper trapezius, rhomboids                     PT Education - 08/09/21 1158     Education Details HEP    Person(s) Educated Patient    Methods Explanation;Demonstration;Handout;Tactile cues;Verbal cues    Comprehension Returned demonstration;Verbalized understanding              PT Short Term Goals - 08/09/21 1203       PT SHORT TERM GOAL #1   Title independent with initial HEP    Time 3    Period Weeks    Status On-going  PT Long Term Goals - 08/09/21 1203       PT LONG TERM GOAL #1   Title I with HEP for upper back pain relief    Time 12    Period Weeks    Status On-going      PT LONG TERM GOAL #2   Title Pt will demo painfree and full cervical ROM    Time 12    Period Weeks    Status On-going      PT LONG TERM GOAL #3   Title Pt will report =/> 70% reduction of mid back pain after a work shift    Time 12    Period Weeks    Status On-going      PT LONG TERM GOAL #4   Title understand posture and body mechanics for job and home    Time 12    Status On-going                   Plan - 08/09/21 1200     Clinical Impression Statement Patient reports decreasing pain, but continues with spasm and tighness in cervical paraspinals, traps, rhomboids.    Stability/Clinical Decision Making Stable/Uncomplicated    Rehab Potential Good    PT Frequency 2x / week    PT Duration 12 weeks    PT Treatment/Interventions ADLs/Self Care Home  Management;Cryotherapy;Electrical Stimulation;Iontophoresis 4mg /ml Dexamethasone;Moist Heat;Traction;Therapeutic exercise;Therapeutic activities;Patient/family education;Manual techniques;Dry needling    PT Next Visit Plan advance gym activities, work on spasms    Consulted and Agree with Plan of Care Patient             Patient will benefit from skilled therapeutic intervention in order to improve the following deficits and impairments:  Decreased range of motion, Increased fascial restricitons, Increased muscle spasms, Pain, Improper body mechanics, Decreased strength, Postural dysfunction  Visit Diagnosis: Pain in thoracic spine  Cervicalgia  Abnormal posture  Other muscle spasm  Symptomatic mammary hypertrophy     Problem List Patient Active Problem List   Diagnosis Date Noted   Ureteral calculus, right 12/01/2011    12/03/2011, PT 08/09/2021, 12:13 PM  [facility] [place] [facility] [address] [place], [place], [place] Phone: [phone]   Fax:  [phone]  Name: Carol Miller MRN: Lloyd Huger Date of Birth: 1981/08/03

## 2021-08-09 NOTE — Therapy (Signed)
[facility] [place] [facility] [address] [place], [place], [address] Phone: [phone]   Fax:  [phone]  Physical Therapy Treatment  Patient Details  Name: Carol Miller MRN: [id number] Date of Birth: [birth year] Referring Provider (PT): Pati Gallo   Encounter Date: 08/09/2021   PT End of Session - 08/09/21 1159     Visit Number 2    Date for PT Re-Evaluation 11/04/21    Authorization Type UHC    PT Start Time 1110    PT Stop Time 1145    PT Time Calculation (min) 35 min    Activity Tolerance Patient tolerated treatment well    Behavior During Therapy [facility] for tasks assessed/performed             Past Medical History:  Diagnosis Date   Asthma    Frequency of urination    Hematuria    History of kidney stones    History of seizures as a child    NONE SINCE PER PT   Nephrolithiasis    Right ureteral stone    Urgency of urination     Past Surgical History:  Procedure Laterality Date   CERVICAL CERCLAGE  10/29/2009   COLONOSCOPY  2016   CYSTOSCOPY/URETEROSCOPY/HOLMIUM LASER/STENT PLACEMENT Right 06/10/2016   Procedure: CYSTOSCOPY/URETEROSCOPY/HOLMIUM LASER/STENT PLACEMENT;  Surgeon: Crist Fat, MD;  Location: [facility];  Service: Urology;  Laterality: Right;   EXTRACORPOREAL SHOCK WAVE LITHOTRIPSY Right 12/01/2011   LAPAROSCOPIC TUBAL LIGATION Bilateral 05/14/2010   w/ fulgeration   TONSILLECTOMY  as child   TUBAL LIGATION      There were no vitals filed for this visit.   Subjective Assessment - 08/09/21 1151     Subjective Patient reports improved pain, minimally impedes her activities, tightness in cervical, upper traps, paraspinals, B.    Currently in Pain? Yes    Pain Score 1     Pain Location Neck    Pain Orientation Right;Left;Upper;Mid;Lower    Pain Descriptors / Indicators Aching    Pain Type Chronic pain;Acute pain                               [facility] Adult  PT Treatment/Exercise - 08/09/21 1118       Exercises   Exercises Neck      Neck Exercises: Theraband   Shoulder Extension 20 reps;Green    Rows 20 reps;Green      Neck Exercises: Seated   Cervical Rotation Right;Left;10 reps    Cervical Rotation Limitations cervical snag with towel to tolerance    Other Seated Exercise cervical extension snag 10 times, to tolerance      Manual Therapy   Manual Therapy Soft tissue mobilization    Manual therapy comments Cervical paraspinals, upper trapezius, rhomboids                     PT Education - 08/09/21 1158     Education Details HEP    Person(s) Educated Patient    Methods Explanation;Demonstration;Handout;Tactile cues;Verbal cues    Comprehension Returned demonstration;Verbalized understanding              PT Short Term Goals - 08/09/21 1203       PT SHORT TERM GOAL #1   Title independent with initial HEP    Time 3    Period Weeks    Status On-going  PT Long Term Goals - 08/09/21 1203       PT LONG TERM GOAL #1   Title I with HEP for upper back pain relief    Time 12    Period Weeks    Status On-going      PT LONG TERM GOAL #2   Title Pt will demo painfree and full cervical ROM    Time 12    Period Weeks    Status On-going      PT LONG TERM GOAL #3   Title Pt will report =/> 70% reduction of mid back pain after a work shift    Time 12    Period Weeks    Status On-going      PT LONG TERM GOAL #4   Title understand posture and body mechanics for job and home    Time 12    Status On-going                   Plan - 08/09/21 1200     Clinical Impression Statement Patient reports decreasing pain, but continues with spasm and tighness in cervical paraspinals, traps, rhomboids.    Stability/Clinical Decision Making Stable/Uncomplicated    Rehab Potential Good    PT Frequency 2x / week    PT Duration 12 weeks    PT Treatment/Interventions ADLs/Self Care Home  Management;Cryotherapy;Electrical Stimulation;Iontophoresis 4mg /ml Dexamethasone;Moist Heat;Traction;Therapeutic exercise;Therapeutic activities;Patient/family education;Manual techniques;Dry needling    PT Next Visit Plan advance gym activities, work on spasms    Consulted and Agree with Plan of Care Patient             Patient will benefit from skilled therapeutic intervention in order to improve the following deficits and impairments:  Decreased range of motion, Increased fascial restricitons, Increased muscle spasms, Pain, Improper body mechanics, Decreased strength, Postural dysfunction  Visit Diagnosis: Pain in thoracic spine  Cervicalgia  Abnormal posture  Other muscle spasm  Symptomatic mammary hypertrophy     Problem List Patient Active Problem List   Diagnosis Date Noted   Ureteral calculus, right 12/01/2011    12/03/2011, DPT 08/09/2021, 12:16 PM  [facility] [place] [facility] [address] [place], [place], [place] Phone: [phone]   Fax:  [phone]  Name: Carol Miller MRN: Carol Miller Date of Birth: 1981/05/27

## 2021-08-09 NOTE — Patient Instructions (Signed)
Access Code: [email or web address] URL: [email or web address] Date: 08/09/2021 Prepared by: Oley Balm  Exercises Seated Assisted Cervical Rotation with Towel - 1 x daily - 7 x weekly - 1 sets - 10 reps Cervical Extension AROM with Strap - 1 x daily - 7 x weekly - 1 sets - 10 reps Standing Shoulder Row with Anchored Resistance - 1 x daily - 7 x weekly - 2 sets - 10 reps Shoulder extension with resistance - Neutral - 1 x daily - 7 x weekly - 2 sets - 10 reps

## 2021-08-12 ENCOUNTER — Ambulatory Visit: Payer: No Typology Code available for payment source | Admitting: Physical Therapy

## 2021-08-17 ENCOUNTER — Encounter: Payer: Self-pay | Admitting: Physical Therapy

## 2021-08-17 ENCOUNTER — Other Ambulatory Visit: Payer: Self-pay

## 2021-08-17 ENCOUNTER — Ambulatory Visit: Payer: No Typology Code available for payment source | Admitting: Physical Therapy

## 2021-08-17 DIAGNOSIS — M542 Cervicalgia: Secondary | ICD-10-CM

## 2021-08-17 DIAGNOSIS — M546 Pain in thoracic spine: Secondary | ICD-10-CM | POA: Diagnosis not present

## 2021-08-17 DIAGNOSIS — R293 Abnormal posture: Secondary | ICD-10-CM

## 2021-08-17 DIAGNOSIS — M62838 Other muscle spasm: Secondary | ICD-10-CM

## 2021-08-17 NOTE — Therapy (Signed)
[facility] [place] [facility] [address] [place], [place], [address] Phone: [phone]   Fax:  [phone]  Physical Therapy Treatment  Patient Details  Name: Carol Miller MRN: [id number] Date of Birth: Nov 02, 1981 Referring Provider (PT): Pati Gallo   Encounter Date: 08/17/2021   PT End of Session - 08/17/21 1415     Visit Number 3    Date for PT Re-Evaluation 11/04/21    PT Start Time 1419    PT Stop Time 1509    PT Time Calculation (min) 50 min    Activity Tolerance Patient tolerated treatment well    Behavior During Therapy [facility] for tasks assessed/performed             Past Medical History:  Diagnosis Date   Asthma    Frequency of urination    Hematuria    History of kidney stones    History of seizures as a child    NONE SINCE PER PT   Nephrolithiasis    Right ureteral stone    Urgency of urination     Past Surgical History:  Procedure Laterality Date   CERVICAL CERCLAGE  10/29/2009   COLONOSCOPY  2016   CYSTOSCOPY/URETEROSCOPY/HOLMIUM LASER/STENT PLACEMENT Right 06/10/2016   Procedure: CYSTOSCOPY/URETEROSCOPY/HOLMIUM LASER/STENT PLACEMENT;  Surgeon: Crist Fat, MD;  Location: [facility];  Service: Urology;  Laterality: Right;   EXTRACORPOREAL SHOCK WAVE LITHOTRIPSY Right 12/01/2011   LAPAROSCOPIC TUBAL LIGATION Bilateral 05/14/2010   w/ fulgeration   TONSILLECTOMY  as child   TUBAL LIGATION      There were no vitals filed for this visit.   Subjective Assessment - 08/17/21 1325     Subjective She reports consistentpain. She visited her nephew over the weekend and feels she may have done too much lifting.    Currently in Pain? Yes    Pain Score 4     Pain Location Neck    Pain Orientation Right;Left    Pain Descriptors / Indicators Aching;Nagging    Pain Type Acute pain    Pain Onset More than a month ago    Pain Frequency Intermittent                                [facility] Adult PT Treatment/Exercise - 08/17/21 1327       Neck Exercises: Machines for Strengthening   Nustep level 4 x 6 minutes.      Neck Exercises: Theraband   Other Theraband Exercises standing serratus press with light green T band resistance.      Neck Exercises: Standing   Wall Push Ups 10 reps      Neck Exercises: Seated   Other Seated Exercise chair push ups 10 reps.    Other Seated Exercise seated trunk extension with arms placed behind hips. 5 reps, hold 10 sec      Neck Exercises: Stretches   Upper Trapezius Stretch 3 reps;10 seconds;Left;Right    Levator Stretch Right;Left;3 reps;10 seconds      Modalities   Modalities Moist Heat;Electrical Stimulation      Moist Heat Therapy   Number Minutes Moist Heat 15 Minutes    Moist Heat Location Cervical      Electrical Stimulation   Electrical Stimulation Location upper trapezius    Electrical Stimulation Action IFC    Electrical Stimulation Parameters 2 channels, 8-9 intensity    Electrical Stimulation Goals Pain  Manual Therapy   Manual Therapy Soft tissue mobilization    Manual therapy comments Cervical paraspinals, upper trapezius, rhomboids                     PT Education - 08/17/21 1413     Education Details HEP updates. Achievwed improved head and neck positoin in stance. Educated patien tto attend to posutre at all times in sit and stand and self correct as needed. Position computer and monitor at adequate height to avoid flexion.    Person(s) Educated Patient    Methods Explanation;Demonstration;Handout    Comprehension Verbalized understanding;Returned demonstration              PT Short Term Goals - 08/17/21 1427       PT SHORT TERM GOAL #1   Title independent with initial HEP    Baseline program modified    Time 2    Period Weeks    Status Revised    Target Date 08/31/21               PT Long Term Goals - 08/09/21 1203        PT LONG TERM GOAL #1   Title I with HEP for upper back pain relief    Time 12    Period Weeks    Status On-going      PT LONG TERM GOAL #2   Title Pt will demo painfree and full cervical ROM    Time 12    Period Weeks    Status On-going      PT LONG TERM GOAL #3   Title Pt will report =/> 70% reduction of mid back pain after a work shift    Time 12    Period Weeks    Status On-going      PT LONG TERM GOAL #4   Title understand posture and body mechanics for job and home    Time 12    Status On-going                   Plan - 08/17/21 1423     Clinical Impression Statement Patient notes increased pain after playing with and lifting her 2 YO nephew this weekend. Responded well to HEP and feels her ROM is improving. toelrated additional HEP exercises. Decreased tone noted while lying in supine, provided e-stim and heat. Educated to chin tuck and to attend to posture at all times to improve cervical positoining.    Stability/Clinical Decision Making Stable/Uncomplicated    Clinical Decision Making Low    Rehab Potential Good    PT Treatment/Interventions ADLs/Self Care Home Management;Cryotherapy;Electrical Stimulation;Iontophoresis 4mg /ml Dexamethasone;Moist Heat;Traction;Therapeutic exercise;Therapeutic activities;Patient/family education;Manual techniques;Dry needling    PT Next Visit Plan Assess progress, tolerance to HEP, iupdate as appropriate.    Consulted and Agree with Plan of Care Patient             Patient will benefit from skilled therapeutic intervention in order to improve the following deficits and impairments:  Decreased range of motion, Increased fascial restricitons, Increased muscle spasms, Pain, Improper body mechanics, Decreased strength, Postural dysfunction  Visit Diagnosis: Pain in thoracic spine  Cervicalgia  Abnormal posture  Other muscle spasm     Problem List Patient Active Problem List   Diagnosis Date Noted   Ureteral  calculus, right 12/01/2011    12/03/2011, DPT 08/17/2021, 2:30 PM  [facility] [place] [facility] [address] [place], [place],  [address] Phone: [phone]   Fax:  [phone]  Name: Carol Miller MRN: [id number] Date of Birth: January 05, 1981

## 2021-08-17 NOTE — Patient Instructions (Addendum)
  Access Code: [email or web address] URL: [email or web address] Date: 08/17/2021 Prepared by: Oley Balm  Exercises Seated Chair Push Ups - 1 x daily - 7 x weekly - 1 sets - 10 reps Wall Push Up - 1 x daily - 7 x weekly - 1 sets - 10 reps Standing Serratus Punch with Resistance - 1 x daily - 7 x weekly - 1 sets - 10 reps

## 2021-08-19 ENCOUNTER — Encounter: Payer: Self-pay | Admitting: Physical Therapy

## 2021-08-19 ENCOUNTER — Ambulatory Visit: Payer: No Typology Code available for payment source | Admitting: Physical Therapy

## 2021-08-19 ENCOUNTER — Other Ambulatory Visit: Payer: Self-pay

## 2021-08-19 DIAGNOSIS — R293 Abnormal posture: Secondary | ICD-10-CM

## 2021-08-19 DIAGNOSIS — M542 Cervicalgia: Secondary | ICD-10-CM

## 2021-08-19 DIAGNOSIS — M546 Pain in thoracic spine: Secondary | ICD-10-CM

## 2021-08-19 DIAGNOSIS — N62 Hypertrophy of breast: Secondary | ICD-10-CM

## 2021-08-19 DIAGNOSIS — M62838 Other muscle spasm: Secondary | ICD-10-CM

## 2021-08-19 NOTE — Therapy (Signed)
[place]. [place], [place], [address] Phone: [phone]   Fax:  [phone]  Physical Therapy Treatment  Patient Details  Name: Carol Miller Bar MRN: [id number] Date of Birth: Aug 25, 1981 Referring Provider (PT): Berle Mull   Encounter Date: 08/19/2021   PT End of Session - 08/19/21 1353     Visit Number 4    Date for PT Re-Evaluation 11/04/21    PT Start Time 1315    PT Stop Time 1400    PT Time Calculation (min) 45 min    Activity Tolerance Patient tolerated treatment well    Behavior During Therapy [facility] for tasks assessed/performed             Past Medical History:  Diagnosis Date   Asthma    Frequency of urination    Hematuria    History of kidney stones    History of seizures as a child    NONE SINCE PER PT   Nephrolithiasis    Right ureteral stone    Urgency of urination     Past Surgical History:  Procedure Laterality Date   CERVICAL CERCLAGE  10/29/2009   COLONOSCOPY  2016   CYSTOSCOPY/URETEROSCOPY/HOLMIUM LASER/STENT PLACEMENT Right 06/10/2016   Procedure: CYSTOSCOPY/URETEROSCOPY/HOLMIUM LASER/STENT PLACEMENT;  Surgeon: Ardis Hughs, MD;  Location: [facility];  Service: Urology;  Laterality: Right;   EXTRACORPOREAL SHOCK WAVE LITHOTRIPSY Right 12/01/2011   LAPAROSCOPIC TUBAL LIGATION Bilateral 05/14/2010   w/ fulgeration   TONSILLECTOMY  as child   TUBAL LIGATION      There were no vitals filed for this visit.   Subjective Assessment - 08/19/21 1322     Subjective She feels the treatment on Tues helped to decreased pain and spasm.    Patient Stated Goals less pain, move better, get stronger    Currently in Pain? Yes    Pain Score 1     Pain Location Neck    Pain Orientation Right;Left    Pain Descriptors / Indicators Aching    Pain Type Chronic pain    Pain Onset More than a month ago    Pain Frequency Intermittent    Aggravating Factors  lifting                                [facility] Adult PT Treatment/Exercise - 08/19/21 0001       Neck Exercises: Machines for Strengthening   Nustep level 5 x 6 minutes.      Neck Exercises: Theraband   Shoulder Extension 20 reps;Green    Rows 20 reps;Green    Other Theraband Exercises standing serratus press with light green T band resistance.      Neck Exercises: Standing   Other Standing Exercises Tai-Chi-Polishing the table 5 reps to each side. Added to HEP      Neck Exercises: Seated   Other Seated Exercise chair push ups 10 reps.      Modalities   Modalities Moist Heat      Moist Heat Therapy   Number Minutes Moist Heat 15 Minutes    Moist Heat Location Cervical      Electrical Stimulation   Electrical Stimulation Location upper trapezius    Electrical Stimulation Action IFC    Electrical Stimulation Parameters 2 channel 8-9 intensity    Electrical Stimulation Goals Pain      Manual Therapy   Manual Therapy Soft tissue mobilization  Manual therapy comments Cervical paraspinals, upper trapezius, rhomboids                     PT Education - 08/19/21 1352     Education Details HEP updated with Tai Chi-Polishing the Table in standing.    Person(s) Educated Patient    Methods Explanation;Demonstration;Verbal cues;Handout    Comprehension Returned demonstration;Verbalized understanding              PT Short Term Goals - 08/17/21 1427       PT SHORT TERM GOAL #1   Title independent with initial HEP    Baseline program modified    Time 2    Period Weeks    Status Revised    Target Date 08/31/21               PT Long Term Goals - 08/09/21 1203       PT LONG TERM GOAL #1   Title I with HEP for upper back pain relief    Time 12    Period Weeks    Status On-going      PT LONG TERM GOAL #2   Title Pt will demo painfree and full cervical ROM    Time 12    Period Weeks    Status On-going      PT LONG TERM GOAL #3    Title Pt will report =/> 70% reduction of mid back pain after a work shift    Time 12    Period Weeks    Status On-going      PT LONG TERM GOAL #4   Title understand posture and body mechanics for job and home    Time 12    Status On-going                   Plan - 08/19/21 1353     Clinical Impression Statement Patient reports improved pain, decreased tightness and tone noted in B upper traps, cervical paraspinals, assessed in sitting. Added new Tai Chi exercise for specific benefit of engaging lower scap stabilizers, relaxation of upper traps, postural control, as well as global relaxation, meditation.    Stability/Clinical Decision Making Stable/Uncomplicated    Clinical Decision Making Low    Rehab Potential Good    PT Frequency 2x / week    PT Duration 8 weeks    PT Treatment/Interventions ADLs/Self Care Home Management;Cryotherapy;Electrical Stimulation;Iontophoresis 4mg /ml Dexamethasone;Moist Heat;Traction;Therapeutic exercise;Therapeutic activities;Patient/family education;Manual techniques;Dry needling    PT Next Visit Plan Continue with current program, progressing exercises as appropriate.    PT Home Exercise Plan updated    Consulted and Agree with Plan of Care Patient             Patient will benefit from skilled therapeutic intervention in order to improve the following deficits and impairments:  Decreased range of motion, Increased fascial restricitons, Increased muscle spasms, Pain, Improper body mechanics, Decreased strength, Postural dysfunction  Visit Diagnosis: Pain in thoracic spine  Cervicalgia  Abnormal posture  Symptomatic mammary hypertrophy  Other muscle spasm     Problem List Patient Active Problem List   Diagnosis Date Noted   Ureteral calculus, right 12/01/2011    Carol Miller, DPT 08/19/2021, 1:59 PM  [place]. [place], [place], [address] Phone:  [phone]   Fax:  [phone]  Name: Carol Miller MRN: [id number] Date of Birth: 21-Oct-1981

## 2021-08-19 NOTE — Patient Instructions (Signed)
Added Tai-Chi- polishing the table to HEP for posture, relaxation, lower trap activation, upper trap relaxation

## 2021-08-23 ENCOUNTER — Ambulatory Visit: Payer: No Typology Code available for payment source | Admitting: Physical Therapy

## 2021-08-27 ENCOUNTER — Encounter: Payer: Self-pay | Admitting: Physical Therapy

## 2021-08-27 ENCOUNTER — Other Ambulatory Visit: Payer: Self-pay

## 2021-08-27 ENCOUNTER — Ambulatory Visit: Payer: No Typology Code available for payment source | Admitting: Physical Therapy

## 2021-08-27 DIAGNOSIS — R293 Abnormal posture: Secondary | ICD-10-CM

## 2021-08-27 DIAGNOSIS — M542 Cervicalgia: Secondary | ICD-10-CM

## 2021-08-27 DIAGNOSIS — M546 Pain in thoracic spine: Secondary | ICD-10-CM | POA: Diagnosis not present

## 2021-08-27 DIAGNOSIS — M62838 Other muscle spasm: Secondary | ICD-10-CM

## 2021-08-27 NOTE — Therapy (Signed)
[facility] [place] [facility] [address] [place], [place], [address] Phone: [phone]   Fax:  [phone]  Physical Therapy Treatment  Patient Details  Name: Carol Miller MRN: [id number] Date of Birth: 1981-08-22 Referring Provider (PT): Pati Gallo   Encounter Date: 08/27/2021   PT End of Session - 08/27/21 1027     Visit Number 5    Date for PT Re-Evaluation 11/04/21    PT Start Time 0947    PT Stop Time 1034    PT Time Calculation (min) 47 min    Activity Tolerance Patient tolerated treatment well    Behavior During Therapy [facility] for tasks assessed/performed             Past Medical History:  Diagnosis Date   Asthma    Frequency of urination    Hematuria    History of kidney stones    History of seizures as a child    NONE SINCE PER PT   Nephrolithiasis    Right ureteral stone    Urgency of urination     Past Surgical History:  Procedure Laterality Date   CERVICAL CERCLAGE  10/29/2009   COLONOSCOPY  2016   CYSTOSCOPY/URETEROSCOPY/HOLMIUM LASER/STENT PLACEMENT Right 06/10/2016   Procedure: CYSTOSCOPY/URETEROSCOPY/HOLMIUM LASER/STENT PLACEMENT;  Surgeon: Crist Fat, MD;  Location: [facility];  Service: Urology;  Laterality: Right;   EXTRACORPOREAL SHOCK WAVE LITHOTRIPSY Right 12/01/2011   LAPAROSCOPIC TUBAL LIGATION Bilateral 05/14/2010   w/ fulgeration   TONSILLECTOMY  as child   TUBAL LIGATION      There were no vitals filed for this visit.   Subjective Assessment - 08/27/21 0934     Subjective Patient reports increased pain in R medial/upper thoracic. She noted a pop and has felt increased pain and spasm since that time.    Currently in Pain? Yes    Pain Score 5     Pain Location Back    Pain Orientation Right    Pain Descriptors / Indicators Sharp;Jabbing    Pain Type Acute pain    Pain Onset In the past 7 days    Pain Frequency Constant    Aggravating Factors  certain positions     Pain Relieving Factors moist heat, TNS    Effect of Pain on Daily Activities pain has limited activity this week.                               [facility] Adult PT Treatment/Exercise - 08/27/21 0001       Exercises   Exercises Lumbar      Neck Exercises: Machines for Strengthening   UBE (Upper Arm Bike) 1.0 x 5 minutes      Neck Exercises: Theraband   Shoulder Extension 20 reps;Red    Rows 20 reps;Red      Neck Exercises: Standing   Other Standing Exercises Wall towel slides up, scaption, lateral. 2 x 10 reps each, B.      Lumbar Exercises: Quadruped   Single Arm Raise Right;Left;5 reps;3 seconds    Straight Leg Raise 5 reps;3 seconds    Opposite Arm/Leg Raise Right arm/Left leg;Left arm/Right leg;5 reps;1 second      Modalities   Modalities Moist Heat      Moist Heat Therapy   Number Minutes Moist Heat 15 Minutes    Moist Heat Location Cervical      Electrical Stimulation   Electrical Stimulation  Location upper trapezius    Electrical Stimulation Action IFC    Electrical Stimulation Parameters 2 channel 8-9 intensity    Electrical Stimulation Goals Pain                     PT Education - 08/27/21 1027     Education Details HEP updates    Person(s) Educated Patient    Methods Explanation;Demonstration;Handout    Comprehension Verbalized understanding;Returned demonstration              PT Short Term Goals - 08/27/21 1040       PT SHORT TERM GOAL #1   Title independent with initial HEP    Baseline program modified    Time 1    Period Weeks    Status On-going    Target Date 08/31/21               PT Long Term Goals - 08/09/21 1203       PT LONG TERM GOAL #1   Title I with HEP for upper back pain relief    Time 12    Period Weeks    Status On-going      PT LONG TERM GOAL #2   Title Pt will demo painfree and full cervical ROM    Time 12    Period Weeks    Status On-going      PT LONG TERM GOAL #3   Title  Pt will report =/> 70% reduction of mid back pain after a work shift    Time 12    Period Weeks    Status On-going      PT LONG TERM GOAL #4   Title understand posture and body mechanics for job and home    Time 12    Status On-going                   Plan - 08/27/21 1028     Clinical Impression Statement Patient reported new onset of R post neck pain, base of neck, appears to be muscle strain. She was able to fully participate and progress with her exercises for strength, stability, and ROM. HEP advanced.    Stability/Clinical Decision Making Stable/Uncomplicated    Clinical Decision Making Low    Rehab Potential Good    PT Frequency 2x / week    PT Duration Other (comment)   7w   PT Treatment/Interventions ADLs/Self Care Home Management;Cryotherapy;Electrical Stimulation;Iontophoresis 4mg /ml Dexamethasone;Moist Heat;Traction;Therapeutic exercise;Therapeutic activities;Patient/family education;Manual techniques;Dry needling    PT Next Visit Plan Assess tolerance of advanced HEP.    PT Home Exercise Plan updated    Consulted and Agree with Plan of Care Patient             Patient will benefit from skilled therapeutic intervention in order to improve the following deficits and impairments:  Decreased range of motion, Increased fascial restricitons, Increased muscle spasms, Pain, Improper body mechanics, Decreased strength, Postural dysfunction  Visit Diagnosis: Pain in thoracic spine  Cervicalgia  Abnormal posture  Other muscle spasm     Problem List Patient Active Problem List   Diagnosis Date Noted   Ureteral calculus, right 12/01/2011    12/03/2011, DPT 08/27/2021, 10:41 AM  [facility] [address] [place], [place], [place] Phone: [phone]   Fax:  [phone]  Name: Carol Miller MRN: Lloyd Huger Date of Birth: 1981/09/10

## 2021-08-27 NOTE — Patient Instructions (Signed)
Access Code: [email or web address] URL: [email or web address] Date: 08/27/2021 Prepared by: Oley Balm  Exercises Shoulder Flexion Wall Slide with Towel - 1 x daily - 7 x weekly - 3 sets - 10 reps Scaption Wall Slide with Towel - 1 x daily - 7 x weekly - 2 sets - 10 reps Shoulder Scaption Wall Slide with Towel - 1 x daily - 7 x weekly - 2 sets - 10 reps Scapular Wall Slides - 1 x daily - 7 x weekly - 2 sets - 10 reps Quadruped Alternating Arm Lift - 1 x daily - 7 x weekly - 1 sets - 5 reps Quadruped Alternating Leg Extensions - 1 x daily - 7 x weekly - 1 sets - 5 reps Bird Dog - 1 x daily - 7 x weekly - 1 sets - 5 reps

## 2021-08-30 ENCOUNTER — Ambulatory Visit: Payer: No Typology Code available for payment source | Admitting: Physical Therapy

## 2021-09-03 ENCOUNTER — Other Ambulatory Visit: Payer: Self-pay

## 2021-09-03 ENCOUNTER — Ambulatory Visit: Payer: No Typology Code available for payment source | Admitting: Physical Therapy

## 2021-09-03 ENCOUNTER — Encounter: Payer: Self-pay | Admitting: Physical Therapy

## 2021-09-03 DIAGNOSIS — R293 Abnormal posture: Secondary | ICD-10-CM

## 2021-09-03 DIAGNOSIS — M546 Pain in thoracic spine: Secondary | ICD-10-CM

## 2021-09-03 DIAGNOSIS — M542 Cervicalgia: Secondary | ICD-10-CM

## 2021-09-03 DIAGNOSIS — M62838 Other muscle spasm: Secondary | ICD-10-CM

## 2021-09-03 NOTE — Therapy (Signed)
[facility] [place] [facility] [address] [place], [place], [address] Phone: [phone]   Fax:  [phone]  Physical Therapy Treatment  Patient Details  Name: Carol Miller MRN: [id number] Date of Birth: [birth year] Referring Provider (PT): Pati Gallo   Encounter Date: 09/03/2021   PT End of Session - 09/03/21 1036     Visit Number 6    Date for PT Re-Evaluation 11/04/21    PT Start Time 0932    PT Stop Time 1014    PT Time Calculation (min) 42 min    Activity Tolerance Patient tolerated treatment well    Behavior During Therapy [facility] for tasks assessed/performed             Past Medical History:  Diagnosis Date   Asthma    Frequency of urination    Hematuria    History of kidney stones    History of seizures as a child    NONE SINCE PER PT   Nephrolithiasis    Right ureteral stone    Urgency of urination     Past Surgical History:  Procedure Laterality Date   CERVICAL CERCLAGE  10/29/2009   COLONOSCOPY  2016   CYSTOSCOPY/URETEROSCOPY/HOLMIUM LASER/STENT PLACEMENT Right 06/10/2016   Procedure: CYSTOSCOPY/URETEROSCOPY/HOLMIUM LASER/STENT PLACEMENT;  Surgeon: Crist Fat, MD;  Location: [facility];  Service: Urology;  Laterality: Right;   EXTRACORPOREAL SHOCK WAVE LITHOTRIPSY Right 12/01/2011   LAPAROSCOPIC TUBAL LIGATION Bilateral 05/14/2010   w/ fulgeration   TONSILLECTOMY  as child   TUBAL LIGATION      There were no vitals filed for this visit.   Subjective Assessment - 09/03/21 0936     Subjective Patient reports iHer acute neck pain is improved.    Currently in Pain? Yes    Pain Score 2     Pain Location Neck    Pain Descriptors / Indicators Aching    Pain Type Chronic pain    Pain Onset More than a month ago    Pain Frequency Intermittent                               [facility] Adult PT Treatment/Exercise - 09/03/21 0001       Neck Exercises: Standing   Other  Standing Exercises Standing shoulder ext and rows with B Theraband 2 x 10 reps.      Lumbar Exercises: Aerobic   Tread Mill 1.5 x 6 min      Lumbar Exercises: Standing   Other Standing Lumbar Exercises standing iwth back to wall, agaisn large Physio ball. Stand in mini squat, with trunk active, upright posture. Rotate upper trunk to R and L slowly x 10.    Other Standing Lumbar Exercises Diagonal reaching with 2# weight, with weight shifts.      Moist Heat Therapy   Moist Heat Location Cervical;Lumbar Spine      Electrical Stimulation   Electrical Stimulation Location upper trapezius    Electrical Stimulation Action IFC    Electrical Stimulation Parameters to tolerance    Electrical Stimulation Goals Pain                     PT Education - 09/03/21 1035     Education Details Educated to patient to POC update to incorporate more functional activities involving entire trunk while maintaining upper traps relaxation.    Person(s) Educated Patient    Methods Explanation;Demonstration  Comprehension Returned demonstration;Verbalized understanding              PT Short Term Goals - 09/03/21 0939       PT SHORT TERM GOAL #1   Title independent with initial HEP    Status Achieved      PT SHORT TERM GOAL #2   Title I with updated HEP    Time 2    Period Weeks    Status New    Target Date 09/17/21               PT Long Term Goals - 08/09/21 1203       PT LONG TERM GOAL #1   Title I with HEP for upper back pain relief    Time 12    Period Weeks    Status On-going      PT LONG TERM GOAL #2   Title Pt will demo painfree and full cervical ROM    Time 12    Period Weeks    Status On-going      PT LONG TERM GOAL #3   Title Pt will report =/> 70% reduction of mid back pain after a work shift    Time 12    Period Weeks    Status On-going      PT LONG TERM GOAL #4   Title understand posture and body mechanics for job and home    Time 12    Status  On-going                   Plan - 09/03/21 1037     Clinical Impression Statement Patient reports that her acute neck pain is improved. Therapist incorporated more functional training/activities to improve trunk stability, posture, and functional control without activating upper traps.    Stability/Clinical Decision Making Stable/Uncomplicated    Clinical Decision Making Low    Rehab Potential Good    PT Frequency Other (comment)   1-2w   PT Duration 6 weeks    PT Treatment/Interventions ADLs/Self Care Home Management;Cryotherapy;Electrical Stimulation;Iontophoresis 4mg /ml Dexamethasone;Moist Heat;Traction;Therapeutic exercise;Therapeutic activities;Patient/family education;Manual techniques;Dry needling    PT Next Visit Plan Update HEP as appropriate    Consulted and Agree with Plan of Care Patient             Patient will benefit from skilled therapeutic intervention in order to improve the following deficits and impairments:  Decreased range of motion, Increased fascial restricitons, Increased muscle spasms, Pain, Improper body mechanics, Decreased strength, Postural dysfunction  Visit Diagnosis: Pain in thoracic spine  Cervicalgia  Abnormal posture  Other muscle spasm     Problem List Patient Active Problem List   Diagnosis Date Noted   Ureteral calculus, right 12/01/2011    12/03/2011, DPT 09/03/2021, 10:40 AM  [facility] [place] [facility] [address] [place], [place], [place] Phone: [phone]   Fax:  [phone]  Name: Carol Miller MRN: Lloyd Huger Date of Birth: Oct 16, 1981

## 2021-09-06 ENCOUNTER — Encounter: Payer: Self-pay | Admitting: Physical Therapy

## 2021-09-06 ENCOUNTER — Ambulatory Visit: Payer: No Typology Code available for payment source | Admitting: Physical Therapy

## 2021-09-06 ENCOUNTER — Other Ambulatory Visit: Payer: Self-pay

## 2021-09-06 DIAGNOSIS — R293 Abnormal posture: Secondary | ICD-10-CM

## 2021-09-06 DIAGNOSIS — M542 Cervicalgia: Secondary | ICD-10-CM

## 2021-09-06 DIAGNOSIS — M546 Pain in thoracic spine: Secondary | ICD-10-CM

## 2021-09-06 NOTE — Therapy (Signed)
[place]. [place], [place], [address] Phone: [phone]   Fax:  [phone]  Physical Therapy Treatment  Patient Details  Name: Carol Miller MRN: [id number] Date of Birth: 1981/09/04 Referring Provider (PT): Berle Mull   Encounter Date: 09/06/2021   PT End of Session - 09/06/21 1011     Visit Number 7    Date for PT Re-Evaluation 11/04/21    PT Start Time 0936    PT Stop Time [id number]    PT Time Calculation (min) 39 min    Activity Tolerance Patient tolerated treatment well    Behavior During Therapy [facility] for tasks assessed/performed             Past Medical History:  Diagnosis Date   Asthma    Frequency of urination    Hematuria    History of kidney stones    History of seizures as a child    NONE SINCE PER PT   Nephrolithiasis    Right ureteral stone    Urgency of urination     Past Surgical History:  Procedure Laterality Date   CERVICAL CERCLAGE  10/29/2009   COLONOSCOPY  2016   CYSTOSCOPY/URETEROSCOPY/HOLMIUM LASER/STENT PLACEMENT Right 06/10/2016   Procedure: CYSTOSCOPY/URETEROSCOPY/HOLMIUM LASER/STENT PLACEMENT;  Surgeon: Ardis Hughs, MD;  Location: [facility];  Service: Urology;  Laterality: Right;   EXTRACORPOREAL SHOCK WAVE LITHOTRIPSY Right 12/01/2011   LAPAROSCOPIC TUBAL LIGATION Bilateral 05/14/2010   w/ fulgeration   TONSILLECTOMY  as child   TUBAL LIGATION      There were no vitals filed for this visit.   Subjective Assessment - 09/06/21 0937     Subjective "Doing pretty good today"    Currently in Pain? Yes    Pain Score 1     Pain Location Scapula    Pain Orientation Right;Posterior                               [facility] Adult PT Treatment/Exercise - 09/06/21 0001       Neck Exercises: Machines for Strengthening   UBE (Upper Arm Bike) L1 x3 min each      Neck Exercises: Standing   Other Standing Exercises Standing  shoulder ext  5lb and rows 10lb  2x10, 2lb Flex & abd 2x10 each    Other Standing Exercises Shoulder ER red 2x10; Tricep Ext 20lb x15, x10      Neck Exercises: Seated   Other Seated Exercise Bent over Fev flys & Ext 2lb 2x10    Other Seated Exercise OHP yellow ball 2x10                       PT Short Term Goals - 09/03/21 [id number]       PT SHORT TERM GOAL #1   Title independent with initial HEP    Status Achieved      PT SHORT TERM GOAL #2   Title I with updated HEP    Time 2    Period Weeks    Status New    Target Date 09/17/21               PT Long Term Goals - 08/09/21 1203       PT LONG TERM GOAL #1   Title I with HEP for upper back pain relief    Time 12    Period Weeks  Status On-going      PT LONG TERM GOAL #2   Title Pt will demo painfree and full cervical ROM    Time 12    Period Weeks    Status On-going      PT LONG TERM GOAL #3   Title Pt will report =/> 70% reduction of mid back pain after a work shift    Time 12    Period Weeks    Status On-going      PT LONG TERM GOAL #4   Title understand posture and body mechanics for job and home    Time 12    Status On-going                   Plan - 09/06/21 1011     Clinical Impression Statement Pt ~ 6 minutes late for today's session. Progressed with more functional strengthening without issue. No reports of increase pain. Postural cue needed with standing rows and extensions. UE fatigue noted with standing shoulder flex and abd.    Stability/Clinical Decision Making Stable/Uncomplicated    Rehab Potential Good    PT Frequency Other (comment)    PT Duration 6 weeks    PT Treatment/Interventions ADLs/Self Care Home Management;Cryotherapy;Electrical Stimulation;Iontophoresis 4mg /ml Dexamethasone;Moist Heat;Traction;Therapeutic exercise;Therapeutic activities;Patient/family education;Manual techniques;Dry needling    PT Next Visit Plan Postural strength             Patient  will benefit from skilled therapeutic intervention in order to improve the following deficits and impairments:  Decreased range of motion, Increased fascial restricitons, Increased muscle spasms, Pain, Improper body mechanics, Decreased strength, Postural dysfunction  Visit Diagnosis: Pain in thoracic spine  Cervicalgia  Abnormal posture     Problem List Patient Active Problem List   Diagnosis Date Noted   Ureteral calculus, right 12/01/2011    12/03/2011, PTA 09/06/2021, 10:14 AM  [facility] [place] [facility] [address] [place], [place], [place] Phone: [phone]   Fax:  [phone]  Name: Carol Miller MRN: Lloyd Huger Date of Birth: 1981-10-16

## 2021-09-17 ENCOUNTER — Ambulatory Visit: Payer: No Typology Code available for payment source | Attending: Sports Medicine | Admitting: Physical Therapy

## 2021-09-17 DIAGNOSIS — M62838 Other muscle spasm: Secondary | ICD-10-CM | POA: Insufficient documentation

## 2021-09-17 DIAGNOSIS — M542 Cervicalgia: Secondary | ICD-10-CM | POA: Insufficient documentation

## 2021-09-17 DIAGNOSIS — R293 Abnormal posture: Secondary | ICD-10-CM | POA: Insufficient documentation

## 2021-09-17 DIAGNOSIS — M546 Pain in thoracic spine: Secondary | ICD-10-CM | POA: Insufficient documentation

## 2021-09-20 ENCOUNTER — Ambulatory Visit: Payer: No Typology Code available for payment source | Admitting: Physical Therapy

## 2021-09-27 ENCOUNTER — Other Ambulatory Visit: Payer: Self-pay

## 2021-09-27 ENCOUNTER — Encounter: Payer: Self-pay | Admitting: Physical Therapy

## 2021-09-27 ENCOUNTER — Ambulatory Visit: Payer: No Typology Code available for payment source | Admitting: Physical Therapy

## 2021-09-27 DIAGNOSIS — M542 Cervicalgia: Secondary | ICD-10-CM | POA: Diagnosis present

## 2021-09-27 DIAGNOSIS — M62838 Other muscle spasm: Secondary | ICD-10-CM | POA: Diagnosis present

## 2021-09-27 DIAGNOSIS — R293 Abnormal posture: Secondary | ICD-10-CM

## 2021-09-27 DIAGNOSIS — M546 Pain in thoracic spine: Secondary | ICD-10-CM | POA: Diagnosis present

## 2021-09-27 NOTE — Therapy (Signed)
[place]. [place], [place], [address] Phone: [phone]   Fax:  [phone]  Physical Therapy Treatment  Patient Details  Name: Carol Miller MRN: [id number] Date of Birth: [birth year] Referring Provider (PT): Berle Mull   Encounter Date: 09/27/2021   PT End of Session - 09/27/21 1750     Visit Number 8    Date for PT Re-Evaluation 11/04/21    Authorization Type UHC    PT Start Time [id number]   arrived late   PT Stop Time 1745    PT Time Calculation (min) 32 min    Activity Tolerance Patient tolerated treatment well    Behavior During Therapy [facility] for tasks assessed/performed             Past Medical History:  Diagnosis Date   Asthma    Frequency of urination    Hematuria    History of kidney stones    History of seizures as a child    NONE SINCE PER PT   Nephrolithiasis    Right ureteral stone    Urgency of urination     Past Surgical History:  Procedure Laterality Date   CERVICAL CERCLAGE  10/29/2009   COLONOSCOPY  2016   CYSTOSCOPY/URETEROSCOPY/HOLMIUM LASER/STENT PLACEMENT Right 06/10/2016   Procedure: CYSTOSCOPY/URETEROSCOPY/HOLMIUM LASER/STENT PLACEMENT;  Surgeon: Ardis Hughs, MD;  Location: [facility];  Service: Urology;  Laterality: Right;   EXTRACORPOREAL SHOCK WAVE LITHOTRIPSY Right 12/01/2011   LAPAROSCOPIC TUBAL LIGATION Bilateral 05/14/2010   w/ fulgeration   TONSILLECTOMY  as child   TUBAL LIGATION      There were no vitals filed for this visit.   Subjective Assessment - 09/27/21 1713     Subjective I'm feeling pretty OK since last time, but I still have that nagging pain in between my shoulder blades. I feel like I'm just getting used to trusting myself and movements again. Different things can flare my pain up, depends on the day- patient care can really flare things up too. DN the first day really helped. My back can feel really tight from time to time  like a knuckle is digging into it.    Patient Stated Goals less pain, move better, get stronger    Currently in Pain? Yes    Pain Score 3     Pain Location Thoracic    Pain Orientation Upper;Left    Pain Descriptors / Indicators Aching                               [facility] Adult PT Treatment/Exercise - 09/27/21 0001       Neck Exercises: Standing   Other Standing Exercises standing flies alternating with standing horizontal ADD 1x10 5#      Neck Exercises: Seated   Other Seated Exercise thoracic 3D excursions 1x10 all directions; machine rows 2x15-20#, lat pulls 2x15 15-20#    Other Seated Exercise supine thoracic extension stretch x2 min, supine thoracic mobs over half foam roller 1x10      Neck Exercises: Supine   Other Supine Exercise supine thoracic extension stretch 1x2 min, then with overhead reach 1x10                     PT Education - 09/27/21 1749     Education Details exercise form, HEP updates, reassess next session    Person(s) Educated Patient    Methods  Explanation    Comprehension Verbalized understanding              PT Short Term Goals - 09/03/21 0939       PT SHORT TERM GOAL #1   Title independent with initial HEP    Status Achieved      PT SHORT TERM GOAL #2   Title I with updated HEP    Time 2    Period Weeks    Status New    Target Date 09/17/21               PT Long Term Goals - 08/09/21 1203       PT LONG TERM GOAL #1   Title I with HEP for upper back pain relief    Time 12    Period Weeks    Status On-going      PT LONG TERM GOAL #2   Title Pt will demo painfree and full cervical ROM    Time 12    Period Weeks    Status On-going      PT LONG TERM GOAL #3   Title Pt will report =/> 70% reduction of mid back pain after a work shift    Time 12    Period Weeks    Status On-going      PT LONG TERM GOAL #4   Title understand posture and body mechanics for job and home    Time 12     Status On-going                   Plan - 09/27/21 1750     Clinical Impression Statement Carol Miller arrives a bit late today, continues to report some nagging pain in high T-spine. Started with thoracic extension stretches and mobility drills (both assigned to HEP), then continued with functional strengthening with progression as tolerated today. Did need some tactile cues for form, but seemed to tolerate session well overall. Will continue efforts. Will need re-assessment next session.    Stability/Clinical Decision Making Stable/Uncomplicated    Clinical Decision Making Low    Rehab Potential Good    PT Frequency Other (comment)    PT Duration 6 weeks    PT Treatment/Interventions ADLs/Self Care Home Management;Cryotherapy;Electrical Stimulation;Iontophoresis 4mg /ml Dexamethasone;Moist Heat;Traction;Therapeutic exercise;Therapeutic activities;Patient/family education;Manual techniques;Dry needling    PT Next Visit Plan reassess    PT Home Exercise Plan updated    Consulted and Agree with Plan of Care Patient             Patient will benefit from skilled therapeutic intervention in order to improve the following deficits and impairments:  Decreased range of motion, Increased fascial restricitons, Increased muscle spasms, Pain, Improper body mechanics, Decreased strength, Postural dysfunction  Visit Diagnosis: Pain in thoracic spine  Cervicalgia  Abnormal posture  Other muscle spasm     Problem List Patient Active Problem List   Diagnosis Date Noted   Ureteral calculus, right 12/01/2011   12/03/2011 PT, DPT, PN2   Supplemental Physical Therapist [facility]    Pager [phone] Acute Rehab Office [phone]   [facility] [place] [address] [facility] [place] [place], [place], [place] Phone: [phone]   Fax:  [phone]  Name: Carol Miller MRN: Lloyd Huger Date of Birth: 1981/10/07

## 2021-10-04 ENCOUNTER — Encounter: Payer: Self-pay | Admitting: Physical Therapy

## 2021-10-04 ENCOUNTER — Other Ambulatory Visit: Payer: Self-pay

## 2021-10-04 ENCOUNTER — Ambulatory Visit: Payer: No Typology Code available for payment source | Admitting: Physical Therapy

## 2021-10-04 DIAGNOSIS — M546 Pain in thoracic spine: Secondary | ICD-10-CM | POA: Diagnosis not present

## 2021-10-04 DIAGNOSIS — M542 Cervicalgia: Secondary | ICD-10-CM

## 2021-10-04 DIAGNOSIS — R293 Abnormal posture: Secondary | ICD-10-CM

## 2021-10-04 NOTE — Therapy (Signed)
[place]. [place], [place], [address] Phone: [phone]   Fax:  [phone]  Physical Therapy Treatment  Patient Details  Name: Carol Miller Learn MRN: [id number] Date of Birth: [birth year] Referring Provider (PT): Berle Mull   Encounter Date: 10/04/2021   PT End of Session - 10/04/21 1802     Visit Number 9    Number of Visits 13    Date for PT Re-Evaluation 11/04/21    Authorization Type UHC    PT Start Time 3716   arrived late   PT Stop Time 1745    PT Time Calculation (min) 32 min    Activity Tolerance Patient tolerated treatment well    Behavior During Therapy [facility] for tasks assessed/performed             Past Medical History:  Diagnosis Date   Asthma    Frequency of urination    Hematuria    History of kidney stones    History of seizures as a child    NONE SINCE PER PT   Nephrolithiasis    Right ureteral stone    Urgency of urination     Past Surgical History:  Procedure Laterality Date   CERVICAL CERCLAGE  10/29/2009   COLONOSCOPY  2016   CYSTOSCOPY/URETEROSCOPY/HOLMIUM LASER/STENT PLACEMENT Right 06/10/2016   Procedure: CYSTOSCOPY/URETEROSCOPY/HOLMIUM LASER/STENT PLACEMENT;  Surgeon: Ardis Hughs, MD;  Location: [facility];  Service: Urology;  Laterality: Right;   EXTRACORPOREAL SHOCK WAVE LITHOTRIPSY Right 12/01/2011   LAPAROSCOPIC TUBAL LIGATION Bilateral 05/14/2010   w/ fulgeration   TONSILLECTOMY  as child   TUBAL LIGATION      There were no vitals filed for this visit.   Subjective Assessment - 10/04/21 1711     Subjective I felt good after last session, I had to use TENS unit and some heat after last session but after that it felt really good. I do feel like its easier to maintain my posture.    Patient Stated Goals less pain, move better, get stronger    Currently in Pain? Yes    Pain Score 2     Pain Location Thoracic    Pain Orientation  Upper;Medial    Pain Descriptors / Indicators Aching    Pain Type Chronic pain    Pain Radiating Towards none    Pain Onset More than a month ago    Pain Frequency Constant    Aggravating Factors  exercise    Pain Relieving Factors moist heat, TENS, antiinflammatories/muscle relaxors    Effect of Pain on Daily Activities just annoying                [facility] PT Assessment - 10/04/21 0001       AROM   Overall AROM Comments cervical flexion ROM still decreased by about 25%, lateral flexion issues resolved      Strength   Overall Strength Comments 4+/5 generally in shoulder mm      Palpation   Palpation comment B UTs very tight and tender, L rhomboids and thoracic paraspinals in spasm and tender                           [facility] Adult PT Treatment/Exercise - 10/04/21 0001       Neck Exercises: Standing   Other Standing Exercises PNFs up and down with 5# 1x10 B    Other Standing Exercises bicep curl with overhead  press 5# 1x5; shoulders on fire 60 seconds x2      Neck Exercises: Seated   Other Seated Exercise machine strengthening 25# 2x10 rows and lats                     PT Education - 10/04/21 1801     Education Details POC moving forward, likely DC to advanced gym program at end of december    Person(s) Educated Patient    Methods Explanation    Comprehension Verbalized understanding              PT Short Term Goals - 10/04/21 1722       PT SHORT TERM GOAL #1   Title independent with initial HEP    Time 1    Period Weeks    Status Achieved    Target Date 08/31/21               PT Long Term Goals - 10/04/21 1722       PT LONG TERM GOAL #1   Title I with HEP for upper back pain relief    Time 12    Period Weeks    Status Achieved      PT LONG TERM GOAL #2   Title Pt will demo painfree and full cervical ROM    Time 12    Period Weeks    Status Partially Met      PT LONG TERM GOAL #3   Title Pt will report =/>  70% reduction of mid back pain after a work shift    Baseline depends on the day and stress levels but overall better than it was- 50% perhaps    Time 12    Period Weeks    Status On-going      PT LONG TERM GOAL #4   Title understand posture and body mechanics for job and home    Time 12    Period Weeks    Status On-going      PT LONG TERM GOAL #5   Title Pt will report ability to provide patient care and assist with mobility w/o limitation due to pain or muscle fatigue in neck and upper back    Baseline not working in patient care anymore- goal does not apply    Status Deferred                   Plan - 10/04/21 1802     Clinical Impression Statement Carol Miller arrives today reporting she feels like she is getting better- continues to have some midline thoracic back pain however, which does vary based on the day. Exam reveals improvements in strength, cervical ROM, and soft tissue restrictions- however she does still demonstrate some thoracic and cervical mobility limitations as well as ongoing soft tissue restrictions and deficits in muscle coordination and strength. Will plan to continue at a 1x/week frequency prior to likely DC to home program at the end of December.    Stability/Clinical Decision Making Stable/Uncomplicated    Clinical Decision Making Low    Rehab Potential Good    PT Frequency 1x / week    PT Duration 4 weeks    PT Treatment/Interventions ADLs/Self Care Home Management;Cryotherapy;Electrical Stimulation;Iontophoresis 65m/ml Dexamethasone;Moist Heat;Traction;Therapeutic exercise;Therapeutic activities;Patient/family education;Manual techniques;Dry needling    PT Next Visit Plan continue to work on machine based strengthening- build up to gym based HEP; dry needling L thoracic paraspinals    PT Home Exercise Plan updated  Consulted and Agree with Plan of Care Patient             Patient will benefit from skilled therapeutic intervention in order to  improve the following deficits and impairments:  Decreased range of motion, Increased fascial restricitons, Increased muscle spasms, Pain, Improper body mechanics, Decreased strength, Postural dysfunction  Visit Diagnosis: Pain in thoracic spine  Cervicalgia  Abnormal posture     Problem List Patient Active Problem List   Diagnosis Date Noted   Ureteral calculus, right 12/01/2011   Carol Miller PT, DPT, PN2   Supplemental Physical Therapist [place]    Pager [phone] Acute Rehab Office [place]. [place], [place], [address] Phone: [phone]   Fax:  [phone]  Name: Carol Miller MRN: [id number] Date of Birth: September 18, 1981

## 2021-10-11 ENCOUNTER — Other Ambulatory Visit: Payer: Self-pay

## 2021-10-11 ENCOUNTER — Encounter: Payer: Self-pay | Admitting: Physical Therapy

## 2021-10-11 ENCOUNTER — Ambulatory Visit: Payer: No Typology Code available for payment source | Attending: Sports Medicine | Admitting: Physical Therapy

## 2021-10-11 DIAGNOSIS — M62838 Other muscle spasm: Secondary | ICD-10-CM | POA: Insufficient documentation

## 2021-10-11 DIAGNOSIS — R293 Abnormal posture: Secondary | ICD-10-CM | POA: Diagnosis present

## 2021-10-11 DIAGNOSIS — M546 Pain in thoracic spine: Secondary | ICD-10-CM | POA: Diagnosis not present

## 2021-10-11 DIAGNOSIS — N62 Hypertrophy of breast: Secondary | ICD-10-CM | POA: Diagnosis present

## 2021-10-11 DIAGNOSIS — M542 Cervicalgia: Secondary | ICD-10-CM | POA: Diagnosis present

## 2021-10-11 NOTE — Therapy (Signed)
[place]. [place], [place], [address] Phone: [phone]   Fax:  [phone]  Physical Therapy Treatment  Patient Details  Name: Carol Miller MRN: [id number] Date of Birth: 11/21/1980 Referring Provider (PT): Berle Mull   Encounter Date: 10/11/2021   PT End of Session - 10/11/21 1554     Visit Number 10    Date for PT Re-Evaluation 11/04/21    PT Start Time 1751    PT Stop Time 1600    PT Time Calculation (min) 39 min             Past Medical History:  Diagnosis Date   Asthma    Frequency of urination    Hematuria    History of kidney stones    History of seizures as a child    NONE SINCE PER PT   Nephrolithiasis    Right ureteral stone    Urgency of urination     Past Surgical History:  Procedure Laterality Date   CERVICAL CERCLAGE  10/29/2009   COLONOSCOPY  2016   CYSTOSCOPY/URETEROSCOPY/HOLMIUM LASER/STENT PLACEMENT Right 06/10/2016   Procedure: CYSTOSCOPY/URETEROSCOPY/HOLMIUM LASER/STENT PLACEMENT;  Surgeon: Ardis Hughs, MD;  Location: [facility];  Service: Urology;  Laterality: Right;   EXTRACORPOREAL SHOCK WAVE LITHOTRIPSY Right 12/01/2011   LAPAROSCOPIC TUBAL LIGATION Bilateral 05/14/2010   w/ fulgeration   TONSILLECTOMY  as child   TUBAL LIGATION      There were no vitals filed for this visit.   Subjective Assessment - 10/11/21 1522     Subjective Everything is going all right    Currently in Pain? Yes    Pain Score 2     Pain Location Neck    Pain Orientation Right                               [facility] Adult PT Treatment/Exercise - 10/11/21 0001       Neck Exercises: Machines for Strengthening   UBE (Upper Arm Bike) L2 x3 min each    Other Machines for Strengthening Rows & Lats 25lb 2x10      Neck Exercises: Standing   Other Standing Exercises Standing shoulder ext 10lb 5lb 2x10    Other Standing Exercises Rows 10lb 2x10       Neck Exercises: Seated   Neck Retraction 10 reps   x2 with band   Other Seated Exercise Horiz Abd green 2x10      Lumbar Exercises: Standing   Other Standing Lumbar Exercises overhead Ext yellow x10 x5                       PT Short Term Goals - 10/04/21 1722       PT SHORT TERM GOAL #1   Title independent with initial HEP    Time 1    Period Weeks    Status Achieved    Target Date 08/31/21               PT Long Term Goals - 10/04/21 1722       PT LONG TERM GOAL #1   Title I with HEP for upper back pain relief    Time 12    Period Weeks    Status Achieved      PT LONG TERM GOAL #2   Title Pt will demo painfree and full cervical ROM  Time 12    Period Weeks    Status Partially Met      PT LONG TERM GOAL #3   Title Pt will report =/> 70% reduction of mid back pain after a work shift    Baseline depends on the day and stress levels but overall better than it was- 50% perhaps    Time 12    Period Weeks    Status On-going      PT LONG TERM GOAL #4   Title understand posture and body mechanics for job and home    Time 12    Period Weeks    Status On-going      PT LONG TERM GOAL #5   Title Pt will report ability to provide patient care and assist with mobility w/o limitation due to pain or muscle fatigue in neck and upper back    Baseline not working in patient care anymore- goal does not apply    Status Deferred                   Plan - 10/11/21 1555     Clinical Impression Statement Pt ~ 6 minutes late for today's PT session. Again she continues to report improvement overall, but reports low pain rating for neck and under shoulder blades. UE burning reported with horizontal abduction and overhead extensions. Pt has a forward head and rounded posture.    Stability/Clinical Decision Making Stable/Uncomplicated    Rehab Potential Good    PT Frequency 1x / week    PT Duration 4 weeks    PT Treatment/Interventions ADLs/Self Care  Home Management;Cryotherapy;Electrical Stimulation;Iontophoresis 12m/ml Dexamethasone;Moist Heat;Traction;Therapeutic exercise;Therapeutic activities;Patient/family education;Manual techniques;Dry needling    PT Next Visit Plan continue to work on machine based strengthening- build up to gym based HEP; dry needling L thoracic paraspinals    PT Home Exercise Plan Access Code: [email or web address] URL: [email or web address]  Date: 10/11/2021  Prepared by: RCheri Fowler   Exercises  Cervical Retraction with Resistance - 1 x daily - 7 x weekly - 3 sets - 10 reps             Patient will benefit from skilled therapeutic intervention in order to improve the following deficits and impairments:  Decreased range of motion, Increased fascial restricitons, Increased muscle spasms, Pain, Improper body mechanics, Decreased strength, Postural dysfunction  Visit Diagnosis: Pain in thoracic spine  Other muscle spasm  Abnormal posture  Cervicalgia     Problem List Patient Active Problem List   Diagnosis Date Noted   Ureteral calculus, right 12/01/2011    RScot Jun PTA 10/11/2021, 3:58 PM  [place] [address]Phone: [phone]  Fax:  [phone] Name: Carol PantojaFree MRN: [id number] of Birth: [birth year]

## 2021-10-26 ENCOUNTER — Other Ambulatory Visit: Payer: Self-pay

## 2021-10-26 ENCOUNTER — Ambulatory Visit: Payer: No Typology Code available for payment source | Admitting: Physical Therapy

## 2021-10-26 DIAGNOSIS — R293 Abnormal posture: Secondary | ICD-10-CM

## 2021-10-26 DIAGNOSIS — N62 Hypertrophy of breast: Secondary | ICD-10-CM

## 2021-10-26 DIAGNOSIS — M546 Pain in thoracic spine: Secondary | ICD-10-CM

## 2021-10-26 DIAGNOSIS — M62838 Other muscle spasm: Secondary | ICD-10-CM

## 2021-10-26 DIAGNOSIS — M542 Cervicalgia: Secondary | ICD-10-CM

## 2021-10-26 NOTE — Therapy (Signed)
[place]. [place], [place], [address] Phone: [phone]   Fax:  [phone]  Physical Therapy Treatment  Patient Details  Name: Carol Miller MRN: [id number] Date of Birth: 03/01/1981 Referring Provider (PT): Berle Mull   Encounter Date: 10/26/2021   PT End of Session - 10/26/21 1521     Visit Number 11    Number of Visits 13    Date for PT Re-Evaluation 11/04/21    Authorization Type UHC    PT Start Time 6659    PT Stop Time 9357    PT Time Calculation (min) 44 min    Activity Tolerance Patient tolerated treatment well    Behavior During Therapy [facility] for tasks assessed/performed             Past Medical History:  Diagnosis Date   Asthma    Frequency of urination    Hematuria    History of kidney stones    History of seizures as a child    NONE SINCE PER PT   Nephrolithiasis    Right ureteral stone    Urgency of urination     Past Surgical History:  Procedure Laterality Date   CERVICAL CERCLAGE  10/29/2009   COLONOSCOPY  2016   CYSTOSCOPY/URETEROSCOPY/HOLMIUM LASER/STENT PLACEMENT Right 06/10/2016   Procedure: CYSTOSCOPY/URETEROSCOPY/HOLMIUM LASER/STENT PLACEMENT;  Surgeon: Ardis Hughs, MD;  Location: [facility];  Service: Urology;  Laterality: Right;   EXTRACORPOREAL SHOCK WAVE LITHOTRIPSY Right 12/01/2011   LAPAROSCOPIC TUBAL LIGATION Bilateral 05/14/2010   w/ fulgeration   TONSILLECTOMY  as child   TUBAL LIGATION      There were no vitals filed for this visit.   Subjective Assessment - 10/26/21 1525     Subjective Everything has been going okay. "It's been a while since I've had a really bad acute spasm. I still have some nagging pain in between my shoulder blades."    Limitations Lifting;House hold activities    Patient Stated Goals less pain, move better, get stronger    Currently in Pain? Yes    Pain Score 2     Pain Location Neck    Pain Radiating  Towards between shoulder blades                               [facility] Adult PT Treatment/Exercise - 10/26/21 0001       Neck Exercises: Machines for Strengthening   UBE (Upper Arm Bike) L2 x3 min each      Neck Exercises: Prone   Axial Exension 10 reps    W Back 10 reps    Other Prone Exercise Y's and I's x10 each      Neck Exercises: Stretches   Other Neck Stretches Standing open/close book x10 each side      Moist Heat Therapy   Number Minutes Moist Heat 10 Minutes    Moist Heat Location Cervical   midback     Manual Therapy   Manual Therapy Soft tissue mobilization    Manual therapy comments skilled assessment and palpation for TPDN and STM    Soft tissue mobilization STW and TPR bilat rhomboids and R c-spine paraspinals              Trigger Point Dry Needling - 10/26/21 0001     Muscles Treated Head and Neck Splenius capitus;Semispinalis capitus;Cervical multifidi    Muscles Treated Upper Quadrant Rhomboids  Splenius capitus Response Palpable increased muscle length    Semispinalis capitus Response Twitch reponse elicited;Palpable increased muscle length    Cervical multifidi Response Palpable increased muscle length    Rhomboids Response Twitch response elicited;Palpable increased muscle length                     PT Short Term Goals - 10/04/21 1722       PT SHORT TERM GOAL #1   Title independent with initial HEP    Time 1    Period Weeks    Status Achieved    Target Date 08/31/21               PT Long Term Goals - 10/04/21 1722       PT LONG TERM GOAL #1   Title I with HEP for upper back pain relief    Time 12    Period Weeks    Status Achieved      PT LONG TERM GOAL #2   Title Pt will demo painfree and full cervical ROM    Time 12    Period Weeks    Status Partially Met      PT LONG TERM GOAL #3   Title Pt will report =/> 70% reduction of mid back pain after a work shift    Baseline depends on the day  and stress levels but overall better than it was- 50% perhaps    Time 12    Period Weeks    Status On-going      PT LONG TERM GOAL #4   Title understand posture and body mechanics for job and home    Time 12    Period Weeks    Status On-going      PT LONG TERM GOAL #5   Title Pt will report ability to provide patient care and assist with mobility w/o limitation due to pain or muscle fatigue in neck and upper back    Baseline not working in patient care anymore- goal does not apply    Status Deferred                   Plan - 10/26/21 1605     Clinical Impression Statement Pt reports overall good improvement. Continued nagging tightness between her shoulder blades and R side of neck. Provided TPDN and STM/manual therapy to address this. Worked on posterior shoulder girdle strengthening and stretching midback.    Stability/Clinical Decision Making Stable/Uncomplicated    Rehab Potential Good    PT Frequency 1x / week    PT Duration 4 weeks    PT Treatment/Interventions ADLs/Self Care Home Management;Cryotherapy;Electrical Stimulation;Iontophoresis 63m/ml Dexamethasone;Moist Heat;Traction;Therapeutic exercise;Therapeutic activities;Patient/family education;Manual techniques;Dry needling    PT Next Visit Plan continue to work on machine based strengthening- build up to gym based HEP; dry needling L thoracic paraspinals    PT Home Exercise Plan Access Code: [email or web address] URL: [email or web address]  Date: 10/11/2021  Prepared by: RCheri Fowler   Exercises  Cervical Retraction with Resistance - 1 x daily - 7 x weekly - 3 sets - 10 reps    Consulted and Agree with Plan of Care Patient             Patient will benefit from skilled therapeutic intervention in order to improve the following deficits and impairments:  Decreased range of motion, Increased fascial restricitons, Increased muscle spasms, Pain, Improper body mechanics, Decreased strength, Postural  dysfunction  Visit Diagnosis: Pain in thoracic  spine  Other muscle spasm  Abnormal posture  Cervicalgia  Symptomatic mammary hypertrophy     Problem List Patient Active Problem List   Diagnosis Date Noted   Ureteral calculus, right 12/01/2011    [facility] April Gordy Levan, PT, DPT 10/26/2021, 4:07 PM  [place]. [place], [place], [address] Phone: [phone]   Fax:  [phone]  Name: Carol Miller MRN: [id number] Date of Birth: Apr 06, 1981

## 2021-11-02 ENCOUNTER — Other Ambulatory Visit: Payer: Self-pay

## 2021-11-02 ENCOUNTER — Ambulatory Visit: Payer: No Typology Code available for payment source | Admitting: Physical Therapy

## 2021-11-02 DIAGNOSIS — M546 Pain in thoracic spine: Secondary | ICD-10-CM | POA: Diagnosis not present

## 2021-11-02 DIAGNOSIS — N62 Hypertrophy of breast: Secondary | ICD-10-CM

## 2021-11-02 DIAGNOSIS — R293 Abnormal posture: Secondary | ICD-10-CM

## 2021-11-02 DIAGNOSIS — M62838 Other muscle spasm: Secondary | ICD-10-CM

## 2021-11-02 DIAGNOSIS — M542 Cervicalgia: Secondary | ICD-10-CM

## 2021-11-02 NOTE — Therapy (Signed)
[place] [place], [place], [address] Phone: [phone]   Fax:  [phone]  Physical Therapy Treatment and Discharge  Patient Details  Name: Carol Miller MRN: [id number] Date of Birth: 02-26-1981 Referring Provider (PT): Berle Mull  PHYSICAL THERAPY DISCHARGE SUMMARY  Visits from Start of Care: 11  Current functional level related to goals / functional outcomes: See below   Remaining deficits: See below   Education / Equipment: Discussed continued stretching and mid/low back strengthening   Patient agrees to discharge. Patient goals were partially met. Patient is being discharged due to being pleased with the current functional level.   Encounter Date: 11/02/2021   PT End of Session - 11/02/21 1535     Visit Number 12    Number of Visits 13    Date for PT Re-Evaluation 11/04/21    Authorization Type [employer]    PT Start Time 1536    PT Stop Time 3016    PT Time Calculation (min) 39 min    Activity Tolerance Patient tolerated treatment well    Behavior During Therapy WFL for tasks assessed/performed             Past Medical History:  Diagnosis Date   Asthma    Frequency of urination    Hematuria    History of kidney stones    History of seizures as a child    NONE SINCE PER PT   Nephrolithiasis    Right ureteral stone    Urgency of urination     Past Surgical History:  Procedure Laterality Date   CERVICAL CERCLAGE  10/29/2009   COLONOSCOPY  2016   CYSTOSCOPY/URETEROSCOPY/HOLMIUM LASER/STENT PLACEMENT Right 06/10/2016   Procedure: CYSTOSCOPY/URETEROSCOPY/HOLMIUM LASER/STENT PLACEMENT;  Surgeon: Ardis Hughs, MD;  Location: [facility];  Service: Urology;  Laterality: Right;   EXTRACORPOREAL SHOCK WAVE LITHOTRIPSY Right 12/01/2011   LAPAROSCOPIC TUBAL LIGATION Bilateral 05/14/2010   w/ fulgeration   TONSILLECTOMY  as child   TUBAL LIGATION      There were no  vitals filed for this visit.   Subjective Assessment - 11/02/21 1537     Subjective Pt reports she feels that pain has been well maintained. She feels ready for PT d/c.    Limitations Lifting;House hold activities    Patient Stated Goals less pain, move better, get stronger    Currently in Pain? Yes    Pain Score 2     Pain Location Neck    Pain Orientation Right    Pain Descriptors / Indicators Aching    Pain Type Chronic pain                [facility] PT Assessment - 11/02/21 0001       AROM   AROM Assessment Site Cervical    Cervical Flexion 45    Cervical Extension 45    Cervical - Right Side Bend 48    Cervical - Left Side Bend 43    Cervical - Right Rotation 100%    Cervical - Left Rotation 100%      Strength   Overall Strength Comments 5/5 in bilat shoulder                           [facility] Adult PT Treatment/Exercise - 11/02/21 0001       Manual Therapy   Manual Therapy Soft tissue mobilization    Manual therapy comments skilled  assessment and palpation for TPDN and STM    Soft tissue mobilization STW and TPR bilat rhomboids, low/mid traps and UTs              Trigger Point Dry Needling - 11/02/21 0001     Muscles Treated Head and Neck Upper trapezius    Muscles Treated Upper Quadrant Lower trapezius;Middle trapezius    Muscles Treated Back/Hip Thoracic multifidi    Upper Trapezius Response Palpable increased muscle length    Rhomboids Response Palpable increased muscle length    Lower trapezius Response Palpable increased muscle length    Middle trapezius Response Palpable increased muscle length    Thoracic multifidi response Palpable increased muscle length                     PT Short Term Goals - 10/04/21 1722       PT SHORT TERM GOAL #1   Title independent with initial HEP    Time 1    Period Weeks    Status Achieved    Target Date 08/31/21               PT Long Term Goals - 11/02/21 1541       PT  LONG TERM GOAL #1   Title I with HEP for upper back pain relief    Time 12    Period Weeks    Status Achieved      PT LONG TERM GOAL #2   Title Pt will demo painfree and full cervical ROM    Time 12    Period Weeks    Status Achieved      PT LONG TERM GOAL #3   Title Pt will report =/> 70% reduction of mid back pain after a work shift    Baseline Reports improvement by 10%    Time 12    Period Weeks    Status Partially Met      PT LONG TERM GOAL #4   Title understand posture and body mechanics for job and home    Time 12    Period Weeks    Status Achieved      PT LONG TERM GOAL #5   Title Pt will report ability to provide patient care and assist with mobility w/o limitation due to pain or muscle fatigue in neck and upper back    Baseline not working in patient care anymore- goal does not apply    Status Deferred                   Plan - 11/02/21 1546     Clinical Impression Statement Pt reports feeling ready for PT d/c. Pt has met or partially met all of her LTGs. Provided one more session of TPDN to address continued midback and UT tightness.    Stability/Clinical Decision Making Stable/Uncomplicated    Rehab Potential Good    PT Frequency 1x / week    PT Duration 4 weeks    PT Treatment/Interventions ADLs/Self Care Home Management;Cryotherapy;Electrical Stimulation;Iontophoresis 69m/ml Dexamethasone;Moist Heat;Traction;Therapeutic exercise;Therapeutic activities;Patient/family education;Manual techniques;Dry needling    PT Next Visit Plan continue to work on machine based strengthening- build up to gym based HEP; dry needling L thoracic paraspinals    PT Home Exercise Plan Access Code: [email or web address] URL: [email or web address]  Date: 10/11/2021  Prepared by: RCheri Fowler   Exercises  Cervical Retraction with Resistance - 1 x daily - 7 x weekly - 3 sets - 10 reps  Consulted and Agree with Plan of Care Patient             Patient will benefit  from skilled therapeutic intervention in order to improve the following deficits and impairments:  Decreased range of motion, Increased fascial restricitons, Increased muscle spasms, Pain, Improper body mechanics, Decreased strength, Postural dysfunction  Visit Diagnosis: Pain in thoracic spine  Other muscle spasm  Abnormal posture  Cervicalgia  Symptomatic mammary hypertrophy     Problem List Patient Active Problem List   Diagnosis Date Noted   Ureteral calculus, right 12/01/2011    [facility] April Gordy Levan, PT, DPT 11/02/2021, 4:20 PM  [place] [place], [place], [address] Phone: [phone]   Fax:  [phone]  Name: Carol Miller MRN: [id number] Date of Birth: 03/22/1981

## 2022-09-06 ENCOUNTER — Other Ambulatory Visit (HOSPITAL_BASED_OUTPATIENT_CLINIC_OR_DEPARTMENT_OTHER): Payer: Self-pay

## 2022-09-06 MED ORDER — FLUARIX QUADRIVALENT 0.5 ML IM SUSY
PREFILLED_SYRINGE | INTRAMUSCULAR | 0 refills | Status: AC
  Filled 2022-09-06: qty 0.5, 1d supply, fill #0

## 2024-05-12 ENCOUNTER — Encounter (HOSPITAL_BASED_OUTPATIENT_CLINIC_OR_DEPARTMENT_OTHER): Payer: Self-pay | Admitting: Emergency Medicine

## 2024-05-12 ENCOUNTER — Emergency Department (HOSPITAL_BASED_OUTPATIENT_CLINIC_OR_DEPARTMENT_OTHER): Admission: EM | Admit: 2024-05-12 | Discharge: 2024-05-12 | Disposition: A | Discharge status: Home / Self Care

## 2024-05-12 DIAGNOSIS — Z91040 Latex allergy status: Secondary | ICD-10-CM | POA: Insufficient documentation

## 2024-05-12 DIAGNOSIS — M545 Low back pain, unspecified: Secondary | ICD-10-CM | POA: Diagnosis present

## 2024-05-12 DIAGNOSIS — Y92410 Unspecified street and highway as the place of occurrence of the external cause: Secondary | ICD-10-CM | POA: Diagnosis not present

## 2024-05-12 MED ORDER — LIDOCAINE 5 % EX PTCH
1.0000 | MEDICATED_PATCH | CUTANEOUS | 0 refills | Status: AC
Start: 2024-05-12 — End: ?

## 2024-05-12 MED ORDER — LIDOCAINE 5 % EX PTCH
1.0000 | MEDICATED_PATCH | Freq: Once | CUTANEOUS | Status: DC
  Administered 2024-05-12: 1 via TRANSDERMAL
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
## 2024-05-12 NOTE — Discharge Instructions (Addendum)
 Take Tylenol  alternating with ibuprofen  every 4 hours with dosages as directed on the packaging.  You may also take your home Flexeril for further pain relief.  I am prescribing you lidocaine  patches that you may also use for further pain relief.  Follow-up with your primary doctor.

## 2024-05-12 NOTE — ED Notes (Signed)
 ED Provider at bedside.
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
